# Patient Record
Sex: Male | Born: 1965 | Race: White | Hispanic: No | Smoking: Never smoker
Health system: Southern US, Community
[De-identification: ages and names within clinical notes are randomized; demographics above are authoritative.]

## PROBLEM LIST (undated history)

## (undated) DIAGNOSIS — C801 Malignant (primary) neoplasm, unspecified: Secondary | ICD-10-CM

## (undated) DIAGNOSIS — I10 Essential (primary) hypertension: Secondary | ICD-10-CM

## (undated) DIAGNOSIS — E785 Hyperlipidemia, unspecified: Secondary | ICD-10-CM

## (undated) DIAGNOSIS — F32A Depression, unspecified: Secondary | ICD-10-CM

## (undated) DIAGNOSIS — G4730 Sleep apnea, unspecified: Secondary | ICD-10-CM

## (undated) DIAGNOSIS — J449 Chronic obstructive pulmonary disease, unspecified: Secondary | ICD-10-CM

## (undated) DIAGNOSIS — F419 Anxiety disorder, unspecified: Secondary | ICD-10-CM

## (undated) DIAGNOSIS — E119 Type 2 diabetes mellitus without complications: Secondary | ICD-10-CM

## (undated) DIAGNOSIS — M1990 Unspecified osteoarthritis, unspecified site: Secondary | ICD-10-CM

## (undated) DIAGNOSIS — K219 Gastro-esophageal reflux disease without esophagitis: Secondary | ICD-10-CM

## (undated) HISTORY — DX: Essential (primary) hypertension: I10

## (undated) HISTORY — DX: Gastro-esophageal reflux disease without esophagitis: K21.9

## (undated) HISTORY — DX: Type 2 diabetes mellitus without complications: E11.9

## (undated) HISTORY — DX: Sleep apnea, unspecified: G47.30

## (undated) HISTORY — DX: Hyperlipidemia, unspecified: E78.5

## (undated) HISTORY — DX: Chronic obstructive pulmonary disease, unspecified: J44.9

## (undated) HISTORY — DX: Unspecified osteoarthritis, unspecified site: M19.90

---
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
## 2005-06-18 ENCOUNTER — Ambulatory Visit: Payer: Self-pay | Admitting: Gastroenterology

## 2005-06-23 ENCOUNTER — Ambulatory Visit: Payer: Self-pay | Admitting: Gastroenterology

## 2005-07-08 ENCOUNTER — Ambulatory Visit: Payer: Self-pay | Admitting: General Surgery

## 2005-08-06 ENCOUNTER — Ambulatory Visit: Payer: Self-pay | Admitting: Gastroenterology

## 2005-12-13 HISTORY — PX: CHOLECYSTECTOMY: SHX55

## 2009-12-15 ENCOUNTER — Ambulatory Visit: Payer: Self-pay | Admitting: General Practice

## 2012-01-04 ENCOUNTER — Emergency Department: Payer: Self-pay | Admitting: *Deleted

## 2012-01-04 LAB — CBC WITH DIFFERENTIAL/PLATELET
Basophil #: 0 10*3/uL (ref 0.0–0.1)
Eosinophil #: 0.1 10*3/uL (ref 0.0–0.7)
Eosinophil %: 1.1 %
HCT: 44.1 % (ref 40.0–52.0)
Lymphocyte %: 26 %
MCHC: 34.4 g/dL (ref 32.0–36.0)
MCV: 87 fL (ref 80–100)
Neutrophil %: 63.4 %
RBC: 5.04 10*6/uL (ref 4.40–5.90)
WBC: 7.1 10*3/uL (ref 3.8–10.6)

## 2012-01-04 LAB — COMPREHENSIVE METABOLIC PANEL
Albumin: 4.3 g/dL (ref 3.4–5.0)
Anion Gap: 8 (ref 7–16)
BUN: 14 mg/dL (ref 7–18)
Calcium, Total: 9.1 mg/dL (ref 8.5–10.1)
Chloride: 101 mmol/L (ref 98–107)
Creatinine: 1.02 mg/dL (ref 0.60–1.30)
EGFR (African American): >60
Osmolality: 279 (ref 275–301)
Potassium: 3.9 mmol/L (ref 3.5–5.1)
SGOT(AST): 41 U/L — ABNORMAL HIGH (ref 15–37)
Total Protein: 7.9 g/dL (ref 6.4–8.2)

## 2012-01-06 ENCOUNTER — Ambulatory Visit: Payer: Self-pay | Admitting: Unknown Physician Specialty

## 2012-01-10 LAB — PATHOLOGY REPORT

## 2012-03-17 ENCOUNTER — Ambulatory Visit: Payer: Self-pay | Admitting: General Practice

## 2015-09-25 ENCOUNTER — Encounter: Payer: Self-pay | Admitting: Physician Assistant

## 2015-09-25 ENCOUNTER — Ambulatory Visit: Payer: Self-pay | Admitting: Physician Assistant

## 2015-09-25 VITALS — BP 120/70 | HR 79 | Temp 98.4°F

## 2015-09-25 DIAGNOSIS — R197 Diarrhea, unspecified: Secondary | ICD-10-CM

## 2015-09-25 NOTE — Progress Notes (Signed)
S: pt states he needs referal to GI, hx of gi problems with ibs; problems started about 10 y ago, recently has been having diarrhea mixed with blood, losing a lot of blood, toilet water will appear bright red, abd in lower quads is tender and sore, no fever/chills, just doesn't feel well, sx started after returning from [place], is on meds for ptsd, had colonoscopy about 2 y ago and it was polyp free, done at [facility]  O: vitals wnl, nad, pt appears tired, lungs c t a, cv rrr, abd soft tender in all areas but increased in lower quads b/l, bs normal, n/v intact  A: bloody diarrhea  P: refer to GI, [place] since pt already established there

## 2015-10-07 NOTE — Progress Notes (Signed)
Patient ID: Luis Coven., male   DOB: 10-07-1966, 49 y.o.   MRN: [id number] Patient has been scheduled to see Lutricia Feil, PA-C/Dr. Donnella Sham at [facility] on 10/15/15 at 8:30.  Pt has been notified.

## 2015-12-11 ENCOUNTER — Encounter: Payer: Self-pay | Admitting: Physician Assistant

## 2015-12-11 ENCOUNTER — Ambulatory Visit: Payer: Self-pay | Admitting: Physician Assistant

## 2015-12-11 VITALS — BP 130/70 | HR 100 | Temp 98.6°F

## 2015-12-11 DIAGNOSIS — M542 Cervicalgia: Secondary | ICD-10-CM

## 2015-12-11 MED ORDER — METHYLPREDNISOLONE 4 MG PO TBPK: ORAL_TABLET | ORAL | Status: DC

## 2015-12-11 MED ORDER — CYCLOBENZAPRINE HCL 10 MG PO TABS: 10.0000 mg | ORAL_TABLET | Freq: Three times a day (TID) | ORAL | Status: DC | PRN

## 2015-12-11 NOTE — Progress Notes (Signed)
S: c/o pain in left side of neck/shoulder with numbness and tingling in thumb and part of index finger, had knot in shoulder which is better, no known injury, hx of same  O: vitals wnl, nad, cspine nontendner, + spasm in left trapezious, full rom, n/v intact  A: acute cervical radiculopathy  P: medrol dose pack, flexeril 10mg  tid, wet heat followed by ice

## 2015-12-14 HISTORY — PX: COLONOSCOPY: SHX174

## 2017-01-06 ENCOUNTER — Encounter: Payer: Self-pay | Admitting: Physician Assistant

## 2017-01-06 ENCOUNTER — Ambulatory Visit: Payer: Self-pay | Admitting: Physician Assistant

## 2017-01-06 VITALS — BP 124/80 | HR 72 | Temp 99.2°F

## 2017-01-06 DIAGNOSIS — J101 Influenza due to other identified influenza virus with other respiratory manifestations: Secondary | ICD-10-CM

## 2017-01-06 DIAGNOSIS — R509 Fever, unspecified: Secondary | ICD-10-CM

## 2017-01-06 LAB — POCT INFLUENZA A/B
INFLUENZA A, POC: NEGATIVE
Influenza B, POC: NEGATIVE

## 2017-01-06 MED ORDER — HYDROCOD POLST-CPM POLST ER 10-8 MG/5ML PO SUER: 5.0000 mL | Freq: Two times a day (BID) | ORAL | 0 refills | Status: DC | PRN

## 2017-01-06 MED ORDER — OSELTAMIVIR PHOSPHATE 75 MG PO CAPS: 75.0000 mg | ORAL_CAPSULE | Freq: Two times a day (BID) | ORAL | 0 refills | Status: DC

## 2017-01-06 NOTE — Progress Notes (Signed)
S: C/o runny nose and congestion with dry cough for 1-2 days, + fever, chills x 1-2d, denies cp/sob, v/d; mucus was  clear throughout the day, cough is sporadic and dry, did have some GI issues but doesn't think it was the stomach flu as he gets the same sx occasionally  Using otc meds:   O: PE: vitals wnl, nad,  perrl eomi, normocephalic, tms dull, nasal mucosa red and swollen, throat injected, neck supple no lymph, lungs c t a, cv rrr, neuro intact, flu swab neg  A:  Acute flu like illness   P: drink fluids, continue regular meds , use otc meds of choice, return if not improving in 5 days, return earlier if worsening , tamiflu, tussionex 143ml nr

## 2017-03-17 ENCOUNTER — Telehealth: Payer: Self-pay | Admitting: Emergency Medicine

## 2017-03-17 NOTE — Telephone Encounter (Signed)
Patient called and expressed that he needs a new script for supplies for his CPAP machine.  I informed Manuela Schwartz and she wrote a script for his cpap supplies.  [facility] to [facility] Melissa from [facility] expressed that they will contact the patient once they receive the new script.

## 2017-04-27 ENCOUNTER — Ambulatory Visit: Payer: Self-pay | Admitting: Physician Assistant

## 2017-04-27 ENCOUNTER — Encounter: Payer: Self-pay | Admitting: Physician Assistant

## 2017-04-27 DIAGNOSIS — R197 Diarrhea, unspecified: Secondary | ICD-10-CM

## 2017-04-27 MED ORDER — METHYLPREDNISOLONE 4 MG PO TBPK: ORAL_TABLET | ORAL | 0 refills | Status: DC

## 2017-04-27 MED ORDER — CIPROFLOXACIN HCL 500 MG PO TABS: 500.0000 mg | ORAL_TABLET | Freq: Two times a day (BID) | ORAL | 0 refills | Status: DC

## 2017-04-27 MED ORDER — ONDANSETRON HCL 4 MG PO TABS: 4.0000 mg | ORAL_TABLET | Freq: Three times a day (TID) | ORAL | 0 refills | Status: DC | PRN

## 2017-04-27 NOTE — Progress Notes (Signed)
S:  Pt c/o nasuea and diarrhea, sx for 6 days, no fever/chills, no abd pain except for cramping with diarrhea; denies cp/sob, denies camping, bad food, recent antibiotics, or exposure to bad water, has hx of ?colitis, had colonoscopy last year Remainder ros neg  O:  Vitals wnl, nad, ENT wnl, neck supple no lymph, lungs c t a, cv rrr, abd soft nontender bs increased lower quads b/l, neuro intact  A:   Gastroenteritis vs colitis  P:  Reassurance, fluids, brat diet, immodium ad for diarrhea if needed, rx zofran prn vomiting, return if not better in 3 days, return earlier if worsening, cipro, medrol dose pack

## 2017-08-10 ENCOUNTER — Ambulatory Visit: Payer: Self-pay | Admitting: Physician Assistant

## 2017-08-10 ENCOUNTER — Encounter: Payer: Self-pay | Admitting: Physician Assistant

## 2017-08-10 DIAGNOSIS — M542 Cervicalgia: Secondary | ICD-10-CM

## 2017-08-10 DIAGNOSIS — M62838 Other muscle spasm: Secondary | ICD-10-CM

## 2017-08-10 MED ORDER — CYCLOBENZAPRINE HCL 10 MG PO TABS: 10.0000 mg | ORAL_TABLET | Freq: Every day | ORAL | 0 refills | Status: DC

## 2017-08-10 MED ORDER — BACLOFEN 10 MG PO TABS: 10.0000 mg | ORAL_TABLET | Freq: Two times a day (BID) | ORAL | 0 refills | Status: DC

## 2017-08-10 MED ORDER — METHYLPREDNISOLONE 4 MG PO TBPK: ORAL_TABLET | ORAL | 0 refills | Status: DC

## 2017-08-10 NOTE — Progress Notes (Signed)
S:  C/o low back pain and left sided neck pain for several days, no known injury, pain is worse with movement, increased with bending over, and with turning his head, states he had to sit in the car for 6 hours the other day and it aggravated his back and hip, saw the chiropractor and is a little better but he recommended muscle relaxers;  denies numbness, tingling, or changes in bowel/urinary habits,  Using otc meds without relief Remainder ros neg  O:  Vitals wnl, nad, lungs c t a, cv rrr, spine nontender, muscles in lower back spasmed , decreased rom with bending forward,  Neg slr, but hamstrings are tight, pt walks without difficulty, no foot drop noted, n/v intact  A: acute back pain, muscle spasms  P: use wet heat followed by ice, stretches, return to clinic if not better in 3 t 5 days, return earlier if worsening, rx meds:medrol dose pack, flexeril at night, baclofen during the day may need to cut in 1/2 to prevent drowsiness

## 2017-08-19 ENCOUNTER — Ambulatory Visit: Payer: Self-pay | Admitting: Physician Assistant

## 2017-08-19 ENCOUNTER — Encounter: Payer: Self-pay | Admitting: Physician Assistant

## 2017-08-19 VITALS — BP 130/70 | HR 93 | Temp 98.5°F | Resp 16

## 2017-08-19 DIAGNOSIS — M7540 Impingement syndrome of unspecified shoulder: Secondary | ICD-10-CM | POA: Insufficient documentation

## 2017-08-19 DIAGNOSIS — M25512 Pain in left shoulder: Secondary | ICD-10-CM

## 2017-08-19 NOTE — Progress Notes (Signed)
S: pt here for shoulder pain and numbness, states he has trouble with his neck but this feels different, feels like muscle failure in the left arm, can't raise it but just so far, was seen previously for neck and shoulder pain, states the muscle isn't as tight, doesn't feel like when he had a bulging disc, no tingling, some numbness at deltoid area, pt works as Environmental manager and states he was able to replace magazine in gun, is r handed  O: vitals wnl, nad, neck nontender, decreased rom of left shoulder, grip is weaker in left hand when compared to right, n/v intact  A: shoulder pain/loss of motion  P: sent to [facility]

## 2017-09-19 ENCOUNTER — Encounter: Payer: Self-pay | Admitting: Physician Assistant

## 2017-09-19 ENCOUNTER — Ambulatory Visit: Payer: Self-pay | Admitting: Physician Assistant

## 2017-09-19 ENCOUNTER — Ambulatory Visit
Admission: RE | Admit: 2017-09-19 | Discharge: 2017-09-19 | Disposition: A | Discharge status: Home / Self Care | Payer: Managed Care, Other (non HMO) | Source: Ambulatory Visit | Attending: Physician Assistant | Admitting: Physician Assistant

## 2017-09-19 VITALS — BP 120/70 | HR 96 | Temp 98.9°F | Resp 16

## 2017-09-19 DIAGNOSIS — M549 Dorsalgia, unspecified: Secondary | ICD-10-CM | POA: Insufficient documentation

## 2017-09-19 DIAGNOSIS — R05 Cough: Secondary | ICD-10-CM | POA: Diagnosis not present

## 2017-09-19 DIAGNOSIS — E669 Obesity, unspecified: Secondary | ICD-10-CM | POA: Insufficient documentation

## 2017-09-19 DIAGNOSIS — J329 Chronic sinusitis, unspecified: Secondary | ICD-10-CM | POA: Insufficient documentation

## 2017-09-19 DIAGNOSIS — W57XXXA Bitten or stung by nonvenomous insect and other nonvenomous arthropods, initial encounter: Secondary | ICD-10-CM

## 2017-09-19 DIAGNOSIS — R059 Cough, unspecified: Secondary | ICD-10-CM

## 2017-09-19 MED ORDER — DOXYCYCLINE HYCLATE 100 MG PO TABS: 100.0000 mg | ORAL_TABLET | Freq: Two times a day (BID) | ORAL | 0 refills | Status: DC

## 2017-09-19 NOTE — Progress Notes (Signed)
S: 1. C/o cough and congestion with wheezing and chest pain, chest is sore from coughing, denies fever, chills, states his cough is dry and hacking; keeping pt and family awake at night;  denies cardiac type chest pain or sob, v/d, abd pain; 2. Had a tick bite several months ago, is having a lot of joint pain, ?if from tick, 3. Area under his arms is really swollen, noticed about 2 months ago, left is worse than the right, area is sore, no other swollen nodes that he knows of  Remainder ros neg  O: vitals wnl, nad, tms clear, throat injected, neck supple no lymph, lungs c t a, cv rrr, neuro intact, axillas have swollen areas, left is size of a lime, right size is a little smaller, no rash or other adenopathy noted at this time, cough is deep and barking  A:  Acute bronchitis   P:  rx medication:  Doxy '100mg'$  bid x 21d, cbc, met c, lymes titer; cxr;  use otc meds, tylenol or motrin as needed for fever/chills, return if not better in 3 -5 days, return earlier if worsening, will call pt with results

## 2017-09-20 LAB — CBC WITH DIFFERENTIAL/PLATELET
Basophils Absolute: 0 10*3/uL (ref 0.0–0.2)
Basos: 1 %
EOS (ABSOLUTE): 0.1 10*3/uL (ref 0.0–0.4)
EOS: 2 %
HEMOGLOBIN: 14.4 g/dL (ref 13.0–17.7)
Hematocrit: 43.5 % (ref 37.5–51.0)
Immature Grans (Abs): 0 10*3/uL (ref 0.0–0.1)
Immature Granulocytes: 0 %
LYMPHS ABS: 1.6 10*3/uL (ref 0.7–3.1)
Lymphs: 30 %
MCH: 29.3 pg (ref 26.6–33.0)
MCHC: 33.1 g/dL (ref 31.5–35.7)
MCV: 88 fL (ref 79–97)
Monocytes Absolute: 0.3 10*3/uL (ref 0.1–0.9)
Monocytes: 7 %
NEUTROS ABS: 3.2 10*3/uL (ref 1.4–7.0)
Neutrophils: 60 %
Platelets: 153 10*3/uL (ref 150–379)
RBC: 4.92 x10E6/uL (ref 4.14–5.80)
RDW: 14.3 % (ref 12.3–15.4)
WBC: 5.2 10*3/uL (ref 3.4–10.8)

## 2017-09-20 LAB — COMPREHENSIVE METABOLIC PANEL
ALBUMIN: 4.6 g/dL (ref 3.5–5.5)
ALK PHOS: 95 IU/L (ref 39–117)
ALT: 66 IU/L — ABNORMAL HIGH (ref 0–44)
AST: 46 IU/L — ABNORMAL HIGH (ref 0–40)
Albumin/Globulin Ratio: 1.5 (ref 1.2–2.2)
BUN/Creatinine Ratio: 10 (ref 9–20)
BUN: 11 mg/dL (ref 6–24)
Bilirubin Total: 0.4 mg/dL (ref 0.0–1.2)
CO2: 23 mmol/L (ref 20–29)
Calcium: 9.7 mg/dL (ref 8.7–10.2)
Chloride: 95 mmol/L — ABNORMAL LOW (ref 96–106)
Creatinine, Ser: 1.05 mg/dL (ref 0.76–1.27)
GFR calc Af Amer: 94 mL/min/{1.73_m2} (ref >59)
GFR calc non Af Amer: 81 mL/min/{1.73_m2} (ref >59)
GLOBULIN, TOTAL: 3.1 g/dL (ref 1.5–4.5)
Glucose: 295 mg/dL — ABNORMAL HIGH (ref 65–99)
POTASSIUM: 4.3 mmol/L (ref 3.5–5.2)
SODIUM: 140 mmol/L (ref 134–144)
TOTAL PROTEIN: 7.7 g/dL (ref 6.0–8.5)

## 2017-09-20 LAB — LYME AB/WESTERN BLOT REFLEX

## 2017-09-28 DIAGNOSIS — M25519 Pain in unspecified shoulder: Secondary | ICD-10-CM | POA: Insufficient documentation

## 2017-10-24 NOTE — Progress Notes (Signed)
Per Susan's authorization I referred the patient to [facility] to see Dr. Jamal Collin on Wednesday, November 21st at 3:30.  Patient has been notified and has accepted the appointment.

## 2017-10-27 ENCOUNTER — Encounter: Payer: Self-pay | Admitting: *Deleted

## 2017-11-02 ENCOUNTER — Ambulatory Visit: Payer: Self-pay | Admitting: General Surgery

## 2017-12-20 ENCOUNTER — Ambulatory Visit (INDEPENDENT_AMBULATORY_CARE_PROVIDER_SITE_OTHER): Payer: Managed Care, Other (non HMO) | Admitting: General Surgery

## 2017-12-20 ENCOUNTER — Encounter: Payer: Self-pay | Admitting: General Surgery

## 2017-12-20 VITALS — BP 130/70 | HR 68 | Resp 14 | Ht 71.0 in | Wt 266.0 lb

## 2017-12-20 DIAGNOSIS — R590 Localized enlarged lymph nodes: Secondary | ICD-10-CM | POA: Diagnosis not present

## 2017-12-20 NOTE — Patient Instructions (Addendum)
Patient to be scheduled for a ct scan .  The patient is aware to call back for any questions or concerns.  The patient is scheduled for a CT of the chest with contrast at [facility] on 12/29/17 at 9:00 am. He is to arrive by 8:45 am and have clear liquids only for 4 hours prior. The patient is aware of date, time, and instructions.

## 2017-12-20 NOTE — Progress Notes (Signed)
Patient ID: Luis Sample., male   DOB: 1966-08-09, 52 y.o.   MRN: [id number]  Chief Complaint  Patient presents with  . Other    HPI Luis Hatfield. is a 52 y.o. male here today for a evaluation of a left axilla mass. He noticed this area about a year ago while applying deodorant.  He was having no discomfort and really thought little else about it.  In August he states he started watching the area more closely, as he had been having troubles with his shoulder requiring PT for a rotator cuff injury.  He continues to have no pain in the area of the axillary envelope.  His rotator cuff symptoms have resolved.  He had not experienced any soft tissue injuries to the upper extremity that would be expected to result in lymphadenopathy.   Patient reports since returning from [place] in 2004 he had had intermittent night sweats usually associated with nightmares and these were fairly infrequent.  They had been steadily improving over the last 5 years.  In the last 4 months, during the same time he had appreciated more swelling in the left axillary envelope, he has had more night sweats without associated nightmares.  He has been having a lot more nightsweet and having more chills. The last time he was at the [place] was in September,2018.  At that time, his primary concern had been that his shoulder and neck symptoms, and he did not discuss with his physician there is concerns about the axillary swelling.  At that visit, his [place] was more concerned about his diabetes.  The patient reports a 12 pound weight loss over the last several months unrelated to his diet or exercise program.  The patient works for the [employer].  Wife, Luis Hatfield is present at visit.  HPI  Past Medical History:  Diagnosis Date  . Diabetes mellitus without complication ([place])   . Hyperlipidemia   . Hypertension     Past Surgical History:  Procedure Laterality Date  . CHOLECYSTECTOMY  2007  .  COLONOSCOPY  2017  . TONSILLECTOMY  1977    Family History  Problem Relation Age of Onset  . Lung cancer Mother   . Colon polyps Father   . Parkinson's disease Father   . Lung cancer Sister     Social History Social History   Tobacco Use  . Smoking status: Never Smoker  . Smokeless tobacco: Never Used  Substance Use Topics  . Alcohol use: Yes    Alcohol/week: 0.0 oz    Frequency: Never  . Drug use: No    No Known Allergies  Current Outpatient Medications  Medication Sig Dispense Refill  . ibuprofen (ADVIL,MOTRIN) 200 MG tablet Take by mouth.    . metFORMIN (GLUMETZA) 500 MG (MOD) 24 hr tablet Take by mouth.    . sertraline (ZOLOFT) 100 MG tablet Take 100 mg by mouth daily.    . simvastatin (ZOCOR) 10 MG tablet Take 10 mg by mouth daily.     No current facility-administered medications for this visit.     Review of Systems Review of Systems  Constitutional: Positive for chills, fatigue and unexpected weight change.  Respiratory: Negative.   Cardiovascular: Negative.   Musculoskeletal: Positive for neck pain.  Skin: Negative for rash and wound.  Hematological: Positive for adenopathy.    Blood pressure 130/70, pulse 68, resp. rate 14, height 5\' 11"  (1.803 m), weight 266 lb (120.7 kg).  Physical Exam Physical Exam  Constitutional: He is oriented to person, place, and time. He appears well-developed and well-nourished.  Eyes: Conjunctivae are normal. No scleral icterus.  Neck: Neck supple.  Cardiovascular: Normal rate, regular rhythm and normal heart sounds.  Pulses:      Carotid pulses are 2+ on the right side, and 2+ on the left side.      Femoral pulses are 2+ on the right side, and 2+ on the left side.      Popliteal pulses are 2+ on the right side, and 2+ on the left side.  Pulmonary/Chest: Effort normal and breath sounds normal.  Abdominal: Soft. Normal appearance and bowel sounds are normal. There is no splenomegaly or hepatomegaly. There is no  tenderness. No hernia.    Lymphadenopathy:       Head (right side): No submental, no submandibular, no tonsillar, no preauricular, no posterior auricular and no occipital adenopathy present.       Head (left side): No submental, no submandibular, no tonsillar, no preauricular, no posterior auricular and no occipital adenopathy present.    He has no cervical adenopathy.    He has axillary adenopathy.       Right: No inguinal, no supraclavicular and no epitrochlear adenopathy present.       Left: No inguinal, no supraclavicular and no epitrochlear adenopathy present.  Enlarged left axillary nodes.  Neurological: He is alert and oriented to person, place, and time.  Skin: Skin is warm and dry.    Data Reviewed Chest x-ray obtained September 19, 2017 for upper respiratory symptoms showed a stable right upper lobe granuloma, narrow mediastinum, no pulmonary lesions or pneumonia.  Cardiac silhouette was normal.   Images reviewed.  Laboratory studies from that same visit showed a normal Lyme IgG/IgM antibody at less than 0.91. Elevated nonfasting blood sugar of 295, creatinine 1.05, normal electrolytes.  Liver function studies notable for a mild increase in the SGOT and SGPT.  (Both less than 70).  CBC from that same visit showed a hemoglobin of 14.4 with an MCV of 88 and a white blood cell count of 5200 with a normal differential.  Assessment    Left axillary lymphadenopathy.    Plan    Based on his weight loss, the symptoms and prominent axillary findings a CT scan of the chest with contrast has been recommended.     Patient to be scheduled for a ct scan .  The patient is aware to call back for any questions or concerns. HPI, Physical Exam, Assessment and Plan have been scribed under the direction and in the presence of Hervey Ard, MD. Gaspar Cola, CMA  The patient is scheduled for a CT of the chest with contrast at [facility] on 12/29/17 at 9:00 am. He is to arrive  by 8:45 am and have clear liquids only for 4 hours prior. The patient is aware of date, time, and instructions.  Documented by Lesly Rubenstein LPN  Luis Hatfield 12/20/2017, 4:40 PM

## 2017-12-23 ENCOUNTER — Other Ambulatory Visit: Payer: Self-pay | Admitting: General Surgery

## 2017-12-23 ENCOUNTER — Telehealth: Payer: Self-pay

## 2017-12-23 DIAGNOSIS — R591 Generalized enlarged lymph nodes: Secondary | ICD-10-CM

## 2017-12-23 NOTE — Telephone Encounter (Signed)
Message left for patient to call back regarding scheduled Chest CT. His insurance company is requiring he get a chest xray first for assessment.

## 2017-12-23 NOTE — Progress Notes (Signed)
Chest CT denied as CXR over 49 days old.  Will obtain repeat CXR, then request appropriate CT study again.

## 2017-12-26 ENCOUNTER — Ambulatory Visit
Admission: RE | Admit: 2017-12-26 | Discharge: 2017-12-26 | Disposition: A | Discharge status: Home / Self Care | Payer: Managed Care, Other (non HMO) | Source: Ambulatory Visit | Attending: General Surgery | Admitting: General Surgery

## 2017-12-26 ENCOUNTER — Telehealth: Payer: Self-pay | Admitting: *Deleted

## 2017-12-26 DIAGNOSIS — R591 Generalized enlarged lymph nodes: Secondary | ICD-10-CM

## 2017-12-26 DIAGNOSIS — R590 Localized enlarged lymph nodes: Secondary | ICD-10-CM | POA: Insufficient documentation

## 2017-12-26 NOTE — Telephone Encounter (Signed)
I called patient on his cell phone today to speak with him.   Patient states he did receive the message Caryl-Lyn left for him on Friday. [place].   The patient states he will go this afternoon to have a walk-in chest x-ray completed either at [facility] or [place].   Patient aware we will leave CT scan as scheduled for now and hopefully we can get approval after chest x-ray is complete.   The patient is also aware if we don't get approval by early afternoon on Wednesday that CT scan for Thursday will be cancelled. He verbalizes understanding.

## 2017-12-27 ENCOUNTER — Telehealth: Payer: Self-pay

## 2017-12-27 NOTE — Telephone Encounter (Signed)
Patient notified that we did receive authorization from his insurance for his scheduled CT of the chest for 12/29/17.

## 2017-12-28 ENCOUNTER — Telehealth: Payer: Self-pay

## 2017-12-28 NOTE — Telephone Encounter (Signed)
Patient called and would like to have his CT of the Chest with contrast done at a different facility that is cheaper. He has requested that this be done at [facility] at [facility]. I let him know that he would need to bring back his imaging on a disk once his scan is complete so the doctor can view these. I let him know that we would cancel his appointment for 12/29/17 for his scan at [facility], and contact him once we had him scheduled with [facility].

## 2017-12-29 ENCOUNTER — Ambulatory Visit: Admission: RE | Admit: 2017-12-29 | Payer: Managed Care, Other (non HMO) | Source: Ambulatory Visit

## 2017-12-29 ENCOUNTER — Other Ambulatory Visit: Payer: Self-pay

## 2017-12-29 DIAGNOSIS — R590 Localized enlarged lymph nodes: Secondary | ICD-10-CM

## 2017-12-30 ENCOUNTER — Telehealth: Payer: Self-pay

## 2017-12-30 NOTE — Telephone Encounter (Signed)
Patient called and states that he has decided not to have his CT done thru [facility]. He is going to pursue going thru the [place] for his CT scan. I let him know to notify us when he gets his CT done. I also let him know to call us back if he wishes to still have [place] schedule his CT scan if the [place] does not work out. He is aware and will let us know.

## 2018-01-26 ENCOUNTER — Ambulatory Visit (INDEPENDENT_AMBULATORY_CARE_PROVIDER_SITE_OTHER): Payer: Worker's Compensation

## 2018-01-26 ENCOUNTER — Encounter: Payer: Self-pay | Admitting: Podiatry

## 2018-01-26 ENCOUNTER — Other Ambulatory Visit: Payer: Self-pay | Admitting: Podiatry

## 2018-01-26 ENCOUNTER — Ambulatory Visit (INDEPENDENT_AMBULATORY_CARE_PROVIDER_SITE_OTHER): Payer: Worker's Compensation | Admitting: Podiatry

## 2018-01-26 VITALS — BP 137/87 | HR 92 | Resp 16

## 2018-01-26 DIAGNOSIS — M779 Enthesopathy, unspecified: Secondary | ICD-10-CM

## 2018-01-26 DIAGNOSIS — S99912A Unspecified injury of left ankle, initial encounter: Secondary | ICD-10-CM

## 2018-01-26 NOTE — Progress Notes (Signed)
   Subjective:    Patient ID: Luis Hatfield., male    DOB: 04-20-1966, 52 y.o.   MRN: [id number]  HPI    Review of Systems  All other systems reviewed and are negative.      Objective:   Physical Exam        Assessment & Plan:
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
## 2018-02-17 ENCOUNTER — Encounter: Payer: Self-pay | Admitting: Family Medicine

## 2018-02-17 ENCOUNTER — Ambulatory Visit: Payer: Self-pay | Admitting: Family Medicine

## 2018-02-17 VITALS — BP 118/72 | HR 90 | Temp 99.0°F | Resp 18

## 2018-02-17 DIAGNOSIS — L720 Epidermal cyst: Secondary | ICD-10-CM

## 2018-02-17 MED ORDER — SULFAMETHOXAZOLE-TRIMETHOPRIM 800-160 MG PO TABS: 1.0000 | ORAL_TABLET | Freq: Two times a day (BID) | ORAL | 0 refills | Status: AC
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
## 2018-02-22 ENCOUNTER — Ambulatory Visit: Payer: Self-pay

## 2018-08-29 ENCOUNTER — Ambulatory Visit: Payer: Self-pay | Admitting: Family Medicine

## 2018-08-29 VITALS — BP 143/73 | HR 88 | Temp 99.2°F | Resp 18

## 2018-08-29 DIAGNOSIS — R05 Cough: Secondary | ICD-10-CM

## 2018-08-29 DIAGNOSIS — J029 Acute pharyngitis, unspecified: Secondary | ICD-10-CM

## 2018-08-29 DIAGNOSIS — J0100 Acute maxillary sinusitis, unspecified: Secondary | ICD-10-CM

## 2018-08-29 DIAGNOSIS — R059 Cough, unspecified: Secondary | ICD-10-CM

## 2018-08-29 LAB — POCT RAPID STREP A (OFFICE): RAPID STREP A SCREEN: NEGATIVE

## 2018-08-29 MED ORDER — AMOXICILLIN-POT CLAVULANATE 875-125 MG PO TABS: 1.0000 | ORAL_TABLET | Freq: Two times a day (BID) | ORAL | 0 refills | Status: AC

## 2018-08-29 MED ORDER — BENZONATATE 200 MG PO CAPS: 200.0000 mg | ORAL_CAPSULE | Freq: Every evening | ORAL | 0 refills | Status: DC | PRN

## 2018-08-29 NOTE — Progress Notes (Signed)
Subjective: Sore throat     Luis Hatfield. is a 52 y.o. male who presents for evaluation of sore throat, nasal congestion, facial pressure, nonproductive cough, and fever (T-max 101 on Saturday) for 5 days.  Patient first developed the sore throat, moderate intensity, it is been slowly/gradually improving.  This was followed by nasal congestion and nonproductive cough and then later he developed mild facial pressure and fever. Fever has improved since Saturday.  Treatment to date: Mucinex.  Denies rash, nausea, vomiting, diarrhea, SOB, wheezing, chest or back pain, ear pain, difficulty swallowing, confusion, anosmia/hyposmia, dental pain, headache, body aches, fatigue, ocular pruritis/discharge, severe symptoms, or initial improvement and then worsening of symptoms. History of smoking, asthma, COPD: Negative. History of recurrent sinus and/or lung infections: 3-4 sinus infections each year, reports feels similar to this.  Patient has seen ENT for this in the past but not recently.  Negative for lung infections. Antibiotic use in the last 3 months: Denies any.   Review of Systems Pertinent items noted in HPI and remainder of comprehensive ROS otherwise negative.     Objective:   Physical Exam General: Awake, alert, and oriented. No acute distress. Well developed, hydrated and nourished. Appears stated age. Nontoxic appearance.  HEENT:  PND noted.  Moderate erythema to posterior oropharynx.  No edema, lesions, petechia, or exudates of pharynx, palate, or inside mouth.  Patient status post tonsillectomy. No erythema or bulging of TM.  Mild erythema/edema to nasal mucosa.  Left maxillary sinus tenderness.  Remainder of sinuses nontender. Supple neck without adenopathy. Cardiac: Heart rate and rhythm are normal. No murmurs, gallops, or rubs are auscultated. S1 and S2 are heard and are of normal intensity.  Respiratory: No signs of respiratory distress. Lungs clear. No tachypnea. Able to speak  in full sentences without dyspnea. Nonlabored respirations.  Skin: Skin is warm, dry and intact. Appropriate color for ethnicity. No cyanosis noted.   Diagnostic Results: Rapid strep- negative.  Assessment:    sinusitis and viral pharyngitis   Plan:    Discussed the diagnosis and treatment of sinusitis and pharyngitis. Suggested symptomatic OTC remedies. Nasal saline spray for congestion.   Advised patient to follow-up with ENT regarding his recurrent sinus infections.  Patient reports that he sees the [place] for all of his care and will reach out to them to schedule this appointment.  Advised patient to call back if he needs any assistance with a referral for ENT. Prescribed Augmentin.  Prescribed Tessalon Perles to use as needed at night for cough.  Patient has taken these medications in the past and tolerated them well without any side/adverse effects.  Side/adverse effects of all medications discussed. Patient's blood pressure is 143/73 today.  Discussed normal values with patient.  Advised patient to monitor this daily and report abnormal values to his primary care provider. Discussed red flag symptoms and circumstances with which to seek medical care.   New Prescriptions   AMOXICILLIN-CLAVULANATE (AUGMENTIN) 875-125 MG TABLET    Take 1 tablet by mouth 2 (two) times daily for 10 days.   BENZONATATE (TESSALON) 200 MG CAPSULE    Take 1 capsule (200 mg total) by mouth at bedtime as needed for cough.

## 2018-11-06 ENCOUNTER — Ambulatory Visit
Admission: RE | Admit: 2018-11-06 | Discharge: 2018-11-06 | Disposition: A | Discharge status: Home / Self Care | Payer: Managed Care, Other (non HMO) | Source: Ambulatory Visit | Attending: Emergency Medicine | Admitting: Emergency Medicine

## 2018-11-06 ENCOUNTER — Ambulatory Visit: Payer: Self-pay | Admitting: Emergency Medicine

## 2018-11-06 ENCOUNTER — Encounter: Payer: Self-pay | Admitting: Emergency Medicine

## 2018-11-06 DIAGNOSIS — J209 Acute bronchitis, unspecified: Secondary | ICD-10-CM

## 2018-11-06 DIAGNOSIS — R059 Cough, unspecified: Secondary | ICD-10-CM

## 2018-11-06 DIAGNOSIS — R05 Cough: Secondary | ICD-10-CM | POA: Diagnosis not present

## 2018-11-06 DIAGNOSIS — J449 Chronic obstructive pulmonary disease, unspecified: Secondary | ICD-10-CM | POA: Insufficient documentation

## 2018-11-06 MED ORDER — AMOXICILLIN-POT CLAVULANATE 875-125 MG PO TABS: 1.0000 | ORAL_TABLET | Freq: Two times a day (BID) | ORAL | 0 refills | Status: DC

## 2018-11-06 MED ORDER — PROMETHAZINE-CODEINE 6.25-10 MG/5ML PO SYRP: ORAL_SOLUTION | ORAL | 0 refills | Status: DC

## 2018-11-06 MED ORDER — PREDNISONE 50 MG PO TABS: 50.0000 mg | ORAL_TABLET | Freq: Every day | ORAL | 0 refills | Status: DC

## 2018-11-06 NOTE — Patient Instructions (Addendum)
You have a severe sinus infection and bronchitis with wheezing.  Chest x-ray today shows no sign of pneumonia. Treatment: Augmentin twice a day for 10 days.  Take with food. Prednisone, 1 daily with food for 5 days. Promethazine with codeine cough syrup, 1 to 2 teaspoons at bedtime if needed for severe cough-caution, may cause drowsiness.    Acute Bronchitis, Adult Acute bronchitis is sudden (acute) swelling of the air tubes (bronchi) in the lungs. Acute bronchitis causes these tubes to fill with mucus, which can make it hard to breathe. It can also cause coughing or wheezing. In adults, acute bronchitis usually goes away within 2 weeks. A cough caused by bronchitis may last up to 3 weeks. Smoking, allergies, and asthma can make the condition worse. Repeated episodes of bronchitis may cause further lung problems, such as chronic obstructive pulmonary disease (COPD). What are the causes? This condition can be caused by germs and by substances that irritate the lungs, including:  Cold and flu viruses. This condition is most often caused by the same virus that causes a cold.  Bacteria.  Exposure to tobacco smoke, dust, fumes, and air pollution.  What increases the risk? This condition is more likely to develop in people who:  Have close contact with someone with acute bronchitis.  Are exposed to lung irritants, such as tobacco smoke, dust, fumes, and vapors.  Have a weak immune system.  Have a respiratory condition such as asthma.  What are the signs or symptoms? Symptoms of this condition include:  A cough.  Coughing up clear, yellow, or green mucus.  Wheezing.  Chest congestion.  Shortness of breath.  A fever.  Body aches.  Chills.  A sore throat.  How is this diagnosed? This condition is usually diagnosed with a physical exam. During the exam, your health care provider may order tests, such as chest X-rays, to rule out other conditions. He or she may also:  Test  a sample of your mucus for bacterial infection.  Check the level of oxygen in your blood. This is done to check for pneumonia.  Do a chest X-ray or lung function testing to rule out pneumonia and other conditions.  Perform blood tests.  Your health care provider will also ask about your symptoms and medical history. How is this treated? Most cases of acute bronchitis clear up over time without treatment. Your health care provider may recommend:  Drinking more fluids. Drinking more makes your mucus thinner, which may make it easier to breathe.  Taking a medicine for a fever or cough.  Taking an antibiotic medicine.  Using an inhaler to help improve shortness of breath and to control a cough.  Using a cool mist vaporizer or humidifier to make it easier to breathe.  Follow these instructions at home: Medicines  Take over-the-counter and prescription medicines only as told by your health care provider.  If you were prescribed an antibiotic, take it as told by your health care provider. Do not stop taking the antibiotic even if you start to feel better. General instructions  Get plenty of rest.  Drink enough fluids to keep your urine clear or pale yellow.  Avoid smoking and secondhand smoke. Exposure to cigarette smoke or irritating chemicals will make bronchitis worse. If you smoke and you need help quitting, ask your health care provider. Quitting smoking will help your lungs heal faster.  Use an inhaler, cool mist vaporizer, or humidifier as told by your health care provider.  Keep all follow-up  visits as told by your health care provider. This is important. How is this prevented? To lower your risk of getting this condition again:  Wash your hands often with soap and water. If soap and water are not available, use hand sanitizer.  Avoid contact with people who have cold symptoms.  Try not to touch your hands to your mouth, nose, or eyes.  Make sure to get the flu shot  every year.  Contact a health care provider if:  Your symptoms do not improve in 2 weeks of treatment. Get help right away if:  You cough up blood.  You have chest pain.  You have severe shortness of breath.  You become dehydrated.  You faint or keep feeling like you are going to faint.  You keep vomiting.  You have a severe headache.  Your fever or chills gets worse. This information is not intended to replace advice given to you by your health care provider. Make sure you discuss any questions you have with your health care provider. Document Released: 01/06/2005 Document Revised: 06/23/2016 Document Reviewed: 05/19/2016 Elsevier Interactive Patient Education  [employer].

## 2018-11-06 NOTE — Progress Notes (Addendum)
Subjective:     Patient ID: Luis Craze., male   DOB: 1966-07-14, 52 y.o.   MRN: [id number] [facility]. HPI  Luis Hatfield is a 52 y.o. male who complains of onset of coughing and chest congestion X 3 months, which is never completely gone away.   Just finished 10-day course of Septra which has not helped significantly.  No chills/sweats +  Fever +  Nasal congestion +  Discolored Post-nasal drainage.  No significant blood Has mild right maxillary sinus pain/pressure No sore throat  +  cough, productive of discolored sputum.  No blood. + wheezing + chest congestion No hemoptysis + shortness of breath No pleuritic pain  No itchy/red eyes Mild right earache.  No drainage from the ear  No nausea No vomiting No abdominal pain No diarrhea  No skin rashes +  Fatigue No myalgias Mild, nonfocal headache    Review of Systems Pertinent items noted in HPI and remainder of comprehensive ROS otherwise negative. Past medical history:  Reviewed his past medical history and medication list as noted in chart.  Type 2 diabetes has been controlled.    Objective:        Physical Exam  Constitutional: Appears fatigued but not toxic.  Coughing frequently.  He is oriented to person, place, and time. He appears well-developed and well-nourished. No distress.  HENT:  Head: Normocephalic and atraumatic.  Right Ear: Tympanic membrane normal.  Left Ear: Tympanic membrane normal.  Nose: Very boggy turbinates and seromucoid drainage.  Mild bilateral maxillary tenderness to palpation Mouth/Throat: Oropharynx is clear and moist. No oropharyngeal exudate.  Eyes: Right eye exhibits no discharge. Left eye exhibits no discharge. No scleral icterus.  Neck: Neck supple. No adenopathy Cardiovascular: Normal rate, regular rhythm and normal heart sounds.  Pulmonary/Chest: No respiratory distress.  Mild late expiratory wheezes throughout.  There are diffuse rhonchi  in all lung fields.  He has no rales. Breath sounds equal bilaterally.  O2 saturation 96% room air Neurological: He is alert and oriented to person, place, and time. Cranial nerves intact Skin: Skin is warm and dry. No rash. Nursing note and vitals reviewed. BP 110/78   Pulse 93   Temp 99.5 F (37.5 C) (Oral)   Resp 16   Ht 5\' 11"  (1.803 m)   Wt 258 lb (117 kg)   SpO2 96%   BMI 35.98 kg/m   Chest x-ray: EXAM:   CHEST - 2 VIEW  COMPARISON:  PA and lateral chest x-ray of December 26, 2017  FINDINGS: The lungs remain mildly hyperinflated. There is no focal infiltrate. There is no pleural effusion. There is a stable subcentimeter calcified nodule in the right upper lobe. The heart and pulmonary vascularity are normal. The mediastinum is normal in width. The trachea is midline. There is no pleural effusion.  IMPRESSION: COPD. Previous granulomatous infection. There is no pneumonia nor other active cardiopulmonary disease.   Electronically Signed   By: David  Martinique M.D.   On: 11/06/2018 15:09  Assessment:     Recurrent, severe maxillary sinusitis and bronchitis with bronchospasm.   Chest x-ray shows no evidence of pneumonia or other active cardiopulmonary disease.    Plan:     Treatment options discussed, as well as risks, benefits, alternatives. He states that he is taken Augmentin years ago and that was extremely effective for severe infection without side effects.  Patient voiced understanding and agreement with the following plans: New Prescriptions   AMOXICILLIN-CLAVULANATE (AUGMENTIN) 875-125 MG TABLET  Take 1 tablet by mouth 2 (two) times daily. For 10 days. Take with food.   PREDNISONE (DELTASONE) 50 MG TABLET    Take 1 tablet (50 mg total) by mouth daily. With food for 5 days.   PROMETHAZINE-CODEINE (PHENERGAN WITH CODEINE) 6.25-10 MG/5ML SYRUP    Take 1-2 teaspoons at bedtime as needed for cough. May cause drowsiness.  Reviewed online PMP aware, and I feel  the benefits of prescribing promethazine with codeine outweigh the risks.  An After Visit Summary was printed and given to the patient. Follow-up with your primary care doctor in 5-7 days if not improving, or sooner if symptoms become worse. Precautions discussed. Red flags discussed. Questions invited and answered. Patient voiced understanding and agreement.

## 2018-11-07 MED ORDER — PROMETHAZINE-CODEINE 6.25-10 MG/5ML PO SYRP: ORAL_SOLUTION | ORAL | 0 refills | Status: DC

## 2018-11-07 NOTE — Progress Notes (Addendum)
Patient called and stated the Pharmacy the prescription was sent in to does not have the Cough med in stock but the [facility] [address] [facility] and would like the Med to be called into that [facility]   reviewed above notes.  We will discontinue the first prescription, then will prescribe a new prescription for promethazine with codeine cough syrup, and [facility]

## 2018-11-07 NOTE — Addendum Note (Signed)
Addended by: Jacqulyn Cane B on: 11/07/2018 01:49 PM   Modules accepted: Orders

## 2018-11-07 NOTE — Addendum Note (Signed)
Addended by: Jacqulyn Cane B on: 11/07/2018 01:46 PM   Modules accepted: Orders

## 2018-11-15 ENCOUNTER — Telehealth: Payer: Self-pay | Admitting: Emergency Medicine

## 2018-11-15 ENCOUNTER — Telehealth: Payer: Self-pay

## 2018-11-15 MED ORDER — METHYLPREDNISOLONE 4 MG PO TBPK: ORAL_TABLET | ORAL | 0 refills | Status: DC

## 2018-11-15 MED ORDER — BENZONATATE 200 MG PO CAPS: ORAL_CAPSULE | ORAL | 0 refills | Status: DC

## 2018-11-15 MED ORDER — AZITHROMYCIN 250 MG PO TABS: ORAL_TABLET | ORAL | 0 refills | Status: DC

## 2018-11-15 NOTE — Telephone Encounter (Signed)
Patient called this morning, he spoke with Bretlyn. Was seen here by me 11/06/2018 for recurrent maxillary sinusitis and bronchitis with mild bronchospasm, chest x-ray showed no pneumonia or active cardiopulmonary disease.  I reviewed that note, he was treated with 10 days of Augmentin, prednisone 50 mg daily for 5 days, and 4 ounces of promethazine with codeine cough syrup. His message today is that his cough persists, although not productive now.  No fever.  No significant wheezing or shortness of breath.  Please advise patient: 1.  I am E-prescribing Tessalon Perles for cough.-I cannot prescribe any more cough medicine with codeine/controlled med. 2.  I am E-prescribing Medrol Dosepak and Zithromax Z-PAK. 3.  Reviewing his past medical history, he needs to follow-up with his PCP this week. 4.  He also needs to follow-up with his ENT within 1 week. 5.  If he feels his cough or breathing is severely worsening, he needs to go to emergency room 6.  Please explain that treating him further (beyond what we prescribe it today) for this problem is beyond the scope of this acute care clinic.  His PCP, ENT and/or lung specialist would need to evaluate and treat further if symptoms persist.

## 2018-11-15 NOTE — Telephone Encounter (Signed)
The Patient had requested a medication refill for his continuing symptoms of cough and congestion since his last visit. The provider reviewed his chart and sent in additional medication for the patient. He was advised of the prescriptions that were sent in as well as to follow up with his PCP in the next few weeks due to his past medical history and to also follow up with his ENT if the symptoms should continue. The patient gave verbal understanding.

## 2019-01-29 ENCOUNTER — Telehealth: Payer: Self-pay

## 2019-01-29 NOTE — Telephone Encounter (Signed)
Message left for the patient to see if he followed up with the [facility] regarding his lymphadenopathy and what transpired.

## 2019-01-29 NOTE — Telephone Encounter (Signed)
-----   Message from Robert Bellow, MD sent at 01/08/2019 10:22 AM EST ----- We saw this patient a year ago about lymphadenopathy.  Last message he was going to the [place] [facility] for evaluation. See if this happened and perhaps he will share what they found. Thanks.

## 2019-03-14 ENCOUNTER — Ambulatory Visit: Payer: Managed Care, Other (non HMO) | Admitting: Adult Health

## 2019-03-14 ENCOUNTER — Other Ambulatory Visit: Payer: Self-pay

## 2019-03-14 VITALS — BP 120/78 | HR 84 | Temp 98.6°F | Resp 14 | Ht 71.0 in | Wt 258.0 lb

## 2019-03-14 DIAGNOSIS — E669 Obesity, unspecified: Secondary | ICD-10-CM | POA: Insufficient documentation

## 2019-03-14 DIAGNOSIS — Z008 Encounter for other general examination: Secondary | ICD-10-CM

## 2019-03-14 DIAGNOSIS — Z0189 Encounter for other specified special examinations: Secondary | ICD-10-CM | POA: Diagnosis not present

## 2019-03-14 NOTE — Patient Instructions (Addendum)
Please see your primary care provider for a yearly physical and labs.  I will have the office call you on your glucose and cholesterol results when they return if you have not heard within 1 week please call the office and will have you follow up with your primary care doctor for any abnormal's. This biometric physical is not a substitute for seeing a primary care provider for a physical. Provider also recommends if you do not have a primary care provider for patient see a  primary care physician/ provider  for a routine physical and to establish primary care. Patient may chose provider of choice. Also gave the [facility] at [phone] or web site at [email or web address] to help assist with finding a primary care doctor. Patient understands this office is acute care and no primary care is in this office.   Follow up with primary care as needed for chronic and maintenance health care- can be seen in this employee clinic for acute care.   Health Maintenance, Male A healthy lifestyle and preventive care is important for your health and wellness. Ask your health care provider about what schedule of regular examinations is right for you. What should I know about weight and diet? Eat a Healthy Diet  Eat plenty of vegetables, fruits, whole grains, low-fat dairy products, and lean protein.  Do not eat a lot of foods high in solid fats, added sugars, or salt.  Maintain a Healthy Weight Regular exercise can help you achieve or maintain a healthy weight. You should:  Do at least 150 minutes of exercise each week. The exercise should increase your heart rate and make you sweat (moderate-intensity exercise).  Do strength-training exercises at least twice a week. Watch Your Levels of Cholesterol and Blood Lipids  Have your blood tested for lipids and cholesterol every 5 years starting at 54 years of age. If you are at high risk for heart disease, you should start having  your blood tested when you are 54 years old. You may need to have your cholesterol levels checked more often if: ? Your lipid or cholesterol levels are high. ? You are older than 53 years of age. ? You are at high risk for heart disease. What should I know about cancer screening? Many types of cancers can be detected early and may often be prevented. Lung Cancer  You should be screened every year for lung cancer if: ? You are a current smoker who has smoked for at least 30 years. ? You are a former smoker who has quit within the past 15 years.  Talk to your health care provider about your screening options, when you should start screening, and how often you should be screened. Colorectal Cancer  Routine colorectal cancer screening usually begins at 54 years of age and should be repeated every 5-10 years until you are 54 years old. You may need to be screened more often if early forms of precancerous polyps or small growths are found. Your health care provider may recommend screening at an earlier age if you have risk factors for colon cancer.  Your health care provider may recommend using home test kits to check for hidden blood in the stool.  A small camera at the end of a tube can be used to examine your colon (sigmoidoscopy or colonoscopy). This checks for the earliest forms of colorectal cancer. Prostate and Testicular Cancer  Depending on your age and overall health, your health care  provider may do certain tests to screen for prostate and testicular cancer.  Talk to your health care provider about any symptoms or concerns you have about testicular or prostate cancer. Skin Cancer  Check your skin from head to toe regularly.  Tell your health care provider about any new moles or changes in moles, especially if: ? There is a change in a mole's size, shape, or color. ? You have a mole that is larger than a pencil eraser.  Always use sunscreen. Apply sunscreen liberally and repeat  throughout the day.  Protect yourself by wearing long sleeves, pants, a wide-brimmed hat, and sunglasses when outside. What should I know about heart disease, diabetes, and high blood pressure?  If you are 65-8 years of age, have your blood pressure checked every 3-5 years. If you are 91 years of age or older, have your blood pressure checked every year. You should have your blood pressure measured twice-once when you are at a hospital or clinic, and once when you are not at a hospital or clinic. Record the average of the two measurements. To check your blood pressure when you are not at a hospital or clinic, you can use: ? An automated blood pressure machine at a pharmacy. ? A home blood pressure monitor.  Talk to your health care provider about your target blood pressure.  If you are between 34-18 years old, ask your health care provider if you should take aspirin to prevent heart disease.  Have regular diabetes screenings by checking your fasting blood sugar level. ? If you are at a normal weight and have a low risk for diabetes, have this test once every three years after the age of 43. ? If you are overweight and have a high risk for diabetes, consider being tested at a younger age or more often.  A one-time screening for abdominal aortic aneurysm (AAA) by ultrasound is recommended for men aged 13-75 years who are current or former smokers. What should I know about preventing infection? Hepatitis B If you have a higher risk for hepatitis B, you should be screened for this virus. Talk with your health care provider to find out if you are at risk for hepatitis B infection. Hepatitis C Blood testing is recommended for:  Everyone born from [birth year] through 1966.  Anyone with known risk factors for hepatitis C. Sexually Transmitted Diseases (STDs)  You should be screened each year for STDs including gonorrhea and chlamydia if: ? You are sexually active and are younger than 54 years of age.  ? You are older than 54 years of age and your health care provider tells you that you are at risk for this type of infection. ? Your sexual activity has changed since you were last screened and you are at an increased risk for chlamydia or gonorrhea. Ask your health care provider if you are at risk.  Talk with your health care provider about whether you are at high risk of being infected with HIV. Your health care provider may recommend a prescription medicine to help prevent HIV infection. What else can I do?  Schedule regular health, dental, and eye exams.  Stay current with your vaccines (immunizations).  Do not use any tobacco products, such as cigarettes, chewing tobacco, and e-cigarettes. If you need help quitting, ask your health care provider.  Limit alcohol intake to no more than 2 drinks per day. One drink equals 12 ounces of beer, 5 ounces of wine, or 1 ounces of hard  liquor.  Do not use street drugs.  Do not share needles.  Ask your health care provider for help if you need support or information about quitting drugs.  Tell your health care provider if you often feel depressed.  Tell your health care provider if you have ever been abused or do not feel safe at home. This information is not intended to replace advice given to you by your health care provider. Make sure you discuss any questions you have with your health care provider. Document Released: 05/27/2008 Document Revised: 07/28/2016 Document Reviewed: 09/02/2015 Elsevier Interactive Patient Education  2019 [employer].

## 2019-03-14 NOTE — Progress Notes (Signed)
[facility]  Subjective:     Patient ID: Luis Hatfield., male   DOB: 1966/11/14, 53 y.o.   MRN: [id number]  HPI   Blood pressure 120/78, pulse 84, temperature 98.6 F (37 C), temperature source Oral, resp. rate 14, height 5\' 11"  (1.803 m), weight 258 lb (117 kg), SpO2 97 %.  Patient is a 53 year old male in no acute distress who comes to the clinic for a brief biometric physical and labs.   He reports he sees his primary care physician at the [facility] in [place] and sees regularly.   Denies any exposure to Coronavirus regions affected on map located on  on Centers for Disease Control guidelines/web site and denies any exposure to any infected persons with coronavirus given recent [place] states outbreak and [place]  state of emergency is in  effect.   Patient  denies any fever, body aches,chills, rash, chest pain, shortness of breath, nausea, vomiting, or diarrhea.   Patient Active Problem List   Diagnosis Date Noted  . Obesity 03/14/2019  . Lymphadenopathy, axillary 12/20/2017  . Shoulder pain 09/28/2017  . Back pain 09/19/2017  . Chronic sinusitis 09/19/2017  . Obesity, unspecified 09/19/2017  . Impingement syndrome of shoulder region 08/19/2017     Review of Systems  Constitutional: Negative for activity change, appetite change, chills, diaphoresis, fatigue, fever and unexpected weight change.  HENT: Negative.   Respiratory: Negative for apnea, cough, choking, chest tightness, shortness of breath, wheezing and stridor.   Cardiovascular: Negative for chest pain, palpitations and leg swelling.  Gastrointestinal: Negative.   Genitourinary: Negative.   Musculoskeletal: Negative.   Skin: Negative.   Allergic/Immunologic: Negative.   Neurological: Negative.   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 Does not bruise/bleed easily.  Psychiatric/Behavioral: Negative.        Objective:   Physical Exam Vitals signs reviewed.  Constitutional:      Appearance: Normal appearance. He is normal weight. He is not toxic-appearing.  HENT:     Head: Normocephalic and atraumatic.     Right Ear: Tympanic membrane, ear canal and external ear normal. There is no impacted cerumen.     Left Ear: Tympanic membrane, ear canal and external ear normal. There is no impacted cerumen.     Nose: Nose normal.     Mouth/Throat:     Mouth: Mucous membranes are moist.     Pharynx: No oropharyngeal exudate.  Eyes:     General: No scleral icterus.       Right eye: No discharge.        Left eye: No discharge.     Conjunctiva/sclera: Conjunctivae normal.     Pupils: Pupils are equal, round, and reactive to light.  Neck:     Musculoskeletal: Normal range of motion and neck supple.  Cardiovascular:     Rate and Rhythm: Normal rate and regular rhythm.     Pulses: Normal pulses.     Heart sounds: Normal heart sounds. No murmur. No friction rub. No gallop.   Pulmonary:     Effort: Pulmonary effort is normal.     Breath sounds: Normal breath sounds.  Abdominal:     Palpations: Abdomen is soft.  Musculoskeletal: Normal range of motion.  Lymphadenopathy:     Cervical: No cervical adenopathy.     Right cervical:  No superficial, deep or posterior cervical adenopathy.    Left cervical: No superficial, deep or posterior cervical adenopathy.     Comments: Patient declined axillary lymph node exam - he reports he sees a doctor for this and denies any change.   Skin:    General: Skin is warm and dry.     Capillary Refill: Capillary refill takes less than 2 seconds.  Neurological:     Mental Status: He is alert and oriented to person, place, and time.     Gait: Gait normal.  Psychiatric:        Mood and Affect: Mood normal.        Behavior: Behavior normal.        Thought  Content: Thought content normal.        Judgment: Judgment normal.        Assessment:     Encounter for biometric screening - Plan: Glucose, random, Lipid Panel With LDL/HDL Ratio      Plan:      Follow up recommended with your primary care physician due to chronic axilla lymph node enlargement - patient declined exam.He reports no change and he has a follow up appointment with his provider.Discussed other etiologies possible and risks versus benefits and need for close follow up. Advised to seek medical attention immediately if any change or worsening symptoms. Patient verbalized understanding of all instructions given and denies any further questions at this time.   Gave and reviewed After Visit Summary(AVS) with patient. Patient is advised to read the after visit summary as well and let the provider know if any question, concerns or clarifications are needed.  .  Please see your primary care provider for a yearly physical and labs.  I will have the office call you on your glucose and cholesterol results when they return if you have not heard within 1 week please call the office and will have you follow up with your primary care doctor for any abnormal's. This biometric physical is not a substitute for seeing a primary care provider for a physical. Provider also recommends if you do not have a primary care provider for patient see a  primary care physician/ provider  for a routine physical and to establish primary care. Patient may chose provider of choice. Also gave the [facility] at [phone] or web site at [email or web address] to help assist with finding a primary care doctor. Patient understands this office is acute care and no primary care is in this office.    Follow up with primary care as needed for chronic and maintenance health care- can be seen in this employee clinic for acute care.

## 2019-03-15 ENCOUNTER — Encounter: Payer: Self-pay | Admitting: Adult Health

## 2019-03-15 ENCOUNTER — Telehealth: Payer: Self-pay | Admitting: Adult Health

## 2019-03-15 DIAGNOSIS — E781 Pure hyperglyceridemia: Secondary | ICD-10-CM

## 2019-03-15 DIAGNOSIS — E119 Type 2 diabetes mellitus without complications: Secondary | ICD-10-CM

## 2019-03-15 LAB — LIPID PANEL WITH LDL/HDL RATIO
Cholesterol, Total: 212 mg/dL — ABNORMAL HIGH (ref 100–199)
HDL: 27 mg/dL — ABNORMAL LOW
Triglycerides: 946 mg/dL (ref 0–149)

## 2019-03-15 LAB — GLUCOSE, RANDOM: Glucose: 254 mg/dL — ABNORMAL HIGH (ref 65–99)

## 2019-03-15 NOTE — Telephone Encounter (Signed)
Patient was called with lab results as below, there is a question as to whether lab was spun down and dilution.  Will have patient repeat labs, offered appointment for tomorrow for 03/16/2019, however patient is unable to, until Tuesday, April 7 for repeat fasting labs appointment is given for 8 AM.  Patient reports that he was fasting and denied any heavy fatty meals before his lab work.  He denies any abdominal pain. He denies alcohol or smoking. He is diabetic.  He does have diarrhea intermittently with metformin this is unchanged from years ago he reports.  He is on metformin 1000 mg daily, he is also on glipizide 10 mg daily. He has lantus in previous medication list but denies use.   His fasting blood glucose for this lab draw was 254. Fasting blood glucose last year per chart review was even higher.  Triglycerides severely elevated at 946.  He denies any new symptoms.  He reports that he has had Hydra glycerides in the past but denies any readings this high.  He reports that he has a primary care provider at the [facility] Lewayne Bunting MD. Discussed importance of checking his blood glucose and monitoring - keeping a log so he can keep this under control. Recommended extreme diet and lifestyle changes low fat, low cholesterol, low glucose diet and taking prescribed medications daily with exercise at least 30 minutes daily, discussed dietary changes to decrease blood glucose as well as triglycerides.  We will repeat these labs next Tuesday to be sure lab results are accurate.  He is advised to call his primary care to schedule an appointment though this may be difficult he reports with a COVID-19 state of emergency. If he is unable  To get in with his primary care.   We will wait for kidney function next Tuesday to result and increase metformin 1000 mg 2 twice daily by mouth and recheck labs in a month if kidney function is normal, after diet and lifestyle aggressive therapy.  Discussed with patient risk and  benefits and that cardiovascular disease, arthrosclerotic lower extremity disease, and other risk associated with hyperglycemia and high triglycerides.  Discussed with patient that he needs close follow-up, and he should report any abdominal pain or symptoms as he is at risk for pancreatitis given elevated triglycerides if this level is accurate if after hours and he has any symptoms he will seek emergency medical care.  Patient education discussed.  Patient verbalized understanding.   Provider thoroughly discussed in collaboration above plan with supervising physician Dr. Miguel Aschoff who is in agreement with the care plan as above.   Orders Placed This Encounter  Procedures  . Hemoglobin A1c  . CBC with Diff  . Comprehensive metabolic panel  . Lipid panel            Lipid Panel With LDL/HDL Ratio  Order: [id number]  Status:  Edited Result - FINAL Visible to patient:  No (Not Released) Next appt:  None Dx:  Encounter for biometric screening   Ref Range & Units 1d ago  Cholesterol, Total 100 - 199 mg/dL 212High    Triglycerides 0 - 149 mg/dL 946High Panic    Comment: Results confirmed on  dilution.   HDL >39 mg/dL 27Low    VLDL Cholesterol Cal 5 - 40 mg/dL Comment   Comment: The calculation for the VLDL cholesterol is not valid when  triglyceride level is >400 mg/dL.   LDL Calculated 0 - 99 mg/dL Comment   Comment:  Triglyceride result indicated is too high for an accurate LDL  cholesterol estimation.   LDl/HDL Ratio ratio CANCELED   Comment: Unable to calculate result since non-numeric result obtained for  component test.                    LDL/HDL Ratio                        Men Women                 1/2 Avg.Risk 1.0  1.5                   Avg.Risk 3.6  3.2                 2X Avg.Risk 6.2  5.0                 3X Avg.Risk 8.0  6.1   Result canceled by  the ancillary.   Resulting Agency  [facility]    Narrative  Performed by: Maryan Puls  Performed at: [address], [place], [place] [id number] Lab Director: Rush Farmer MD, Phone: [phone]    Specimen Collected: 03/14/19 09:10 Last Resulted: 03/15/19 02:35     Lab Flowsheet    Order Details    View Encounter    Lab and Collection Details    Routing    Result History          Other Results from 03/14/2019   Contains abnormal data Glucose, random  Order: [id number]   Status:  Final result Visible to patient:  No (Not Released) Next appt:  None Dx:  Encounter for biometric screening   Ref Range & Units 1d ago 19yr ago  Glucose 65 - 99 mg/dL 254High   295High    Resulting Agency  [facility]    Narrative  Performed by: Maryan Puls  Performed at: [address] [place], [place] [id number] Lab Director: Rush Farmer MD, Phone: [phone]

## 2019-03-15 NOTE — Progress Notes (Signed)
See telephone note recorded 03/15/19. Recheck on 03/20/19 along with other labs. Discussed aggressive lifestyle and diet and emergent symptoms and when to see care.  Patient verbalized understanding of all instructions given and denies any further questions at this time.

## 2019-03-20 ENCOUNTER — Other Ambulatory Visit: Payer: Managed Care, Other (non HMO)

## 2019-03-20 ENCOUNTER — Other Ambulatory Visit: Payer: Self-pay

## 2019-03-20 DIAGNOSIS — Z0189 Encounter for other specified special examinations: Secondary | ICD-10-CM | POA: Diagnosis not present

## 2019-03-21 ENCOUNTER — Encounter: Payer: Self-pay | Admitting: Emergency Medicine

## 2019-03-21 ENCOUNTER — Other Ambulatory Visit: Payer: Self-pay

## 2019-03-21 ENCOUNTER — Ambulatory Visit: Payer: Managed Care, Other (non HMO) | Admitting: Emergency Medicine

## 2019-03-21 DIAGNOSIS — E1165 Type 2 diabetes mellitus with hyperglycemia: Secondary | ICD-10-CM | POA: Diagnosis not present

## 2019-03-21 DIAGNOSIS — E782 Mixed hyperlipidemia: Secondary | ICD-10-CM | POA: Diagnosis not present

## 2019-03-21 LAB — LIPID PANEL WITH LDL/HDL RATIO
Cholesterol, Total: 159 mg/dL (ref 100–199)
HDL: 29 mg/dL — ABNORMAL LOW (ref >39)
Triglycerides: 575 mg/dL (ref 0–149)

## 2019-03-21 LAB — COMPREHENSIVE METABOLIC PANEL
ALT: 40 IU/L (ref 0–44)
AST: 30 IU/L (ref 0–40)
Albumin/Globulin Ratio: 2 (ref 1.2–2.2)
Albumin: 4.6 g/dL (ref 3.8–4.9)
Alkaline Phosphatase: 81 IU/L (ref 39–117)
BUN/Creatinine Ratio: 12 (ref 9–20)
BUN: 11 mg/dL (ref 6–24)
Bilirubin Total: 0.3 mg/dL (ref 0.0–1.2)
CO2: 24 mmol/L (ref 20–29)
Calcium: 9.9 mg/dL (ref 8.7–10.2)
Chloride: 97 mmol/L (ref 96–106)
Creatinine, Ser: 0.91 mg/dL (ref 0.76–1.27)
GFR calc Af Amer: 111 mL/min/{1.73_m2} (ref >59)
GFR calc non Af Amer: 96 mL/min/{1.73_m2} (ref >59)
Globulin, Total: 2.3 g/dL (ref 1.5–4.5)
Glucose: 198 mg/dL — ABNORMAL HIGH (ref 65–99)
Potassium: 4.7 mmol/L (ref 3.5–5.2)
Sodium: 140 mmol/L (ref 134–144)
Total Protein: 6.9 g/dL (ref 6.0–8.5)

## 2019-03-21 LAB — CBC WITH DIFFERENTIAL/PLATELET
Basophils Absolute: 0.1 10*3/uL (ref 0.0–0.2)
Basos: 1 %
EOS (ABSOLUTE): 0.1 10*3/uL (ref 0.0–0.4)
Eos: 2 %
Hematocrit: 43.2 % (ref 37.5–51.0)
Hemoglobin: 14.6 g/dL (ref 13.0–17.7)
Immature Grans (Abs): 0.1 10*3/uL (ref 0.0–0.1)
Immature Granulocytes: 1 %
Lymphocytes Absolute: 1.5 10*3/uL (ref 0.7–3.1)
Lymphs: 25 %
MCH: 28.7 pg (ref 26.6–33.0)
MCHC: 33.8 g/dL (ref 31.5–35.7)
MCV: 85 fL (ref 79–97)
Monocytes Absolute: 0.4 10*3/uL (ref 0.1–0.9)
Monocytes: 7 %
Neutrophils Absolute: 3.8 10*3/uL (ref 1.4–7.0)
Neutrophils: 64 %
Platelets: 149 10*3/uL — ABNORMAL LOW (ref 150–450)
RBC: 5.08 x10E6/uL (ref 4.14–5.80)
RDW: 13.3 % (ref 11.6–15.4)
WBC: 5.9 10*3/uL (ref 3.4–10.8)

## 2019-03-21 LAB — HGB A1C W/O EAG: Hgb A1c MFr Bld: 10.7 % — ABNORMAL HIGH (ref 4.8–5.6)

## 2019-03-21 NOTE — Progress Notes (Signed)
Virtual Visit via Telephone Note   [facility]  I connected with Luis Hatfield. on 03/21/19 at 12:30 PM EDT by telephone and verified that I am speaking with the correct person using two identifiers.   I discussed the limitations, risks, security and privacy concerns of performing an evaluation and management service by telephone and the availability of in person appointments. I also discussed with the patient that there may be a patient responsible charge related to this service. The patient expressed understanding and agreed to proceed.   Patient ID: Luis Hatfield. DOB: 53 y.o. MRN: [id number]   Subjective: With patient's consent, we arranged telephone/telehealth visit to review labs drawn yesterday. 10 minutes phone call to patient's cell phone [phone] For the record, he requested we note that his personal email is [email or web address]  I reviewed extensive phone note from [place] of 03/15/2019, and gathered further information from patient. His PCP is Loma Boston, PA at [facility] [place] [facility]. I carefully reviewed his medication list, and he states his PCP has him on the following, per visit there about 6 months ago: Glipizide 10 mg daily. Metformin 1000 mg twice daily Lantus 100 units/mL, 24 units at bedtime.-He admits he has not been taking the Lantus for "some time" He denies hypoglycemia symptoms with or without the Lantus.  Denies any significant side effects on the glipizide and metformin.  Denies any acute abdominal pain, nausea or vomiting. No chest pain or shortness of breath.  No lightheadedness or syncope.  No fever or chills or cough.  Recent Results   Glucose, random     Status: Abnormal   Collection Time: 03/14/19  9:10 AM  Result Value Ref Range   Glucose 254 (H) 65 - 99 mg/dL  Lipid Panel With LDL/HDL Ratio     Status: Abnormal   Collection Time: 03/14/19  9:10 AM  Result Value Ref Range    Cholesterol, Total 212 (H) 100 - 199 mg/dL   Triglycerides 946 (HH) 0 - 149 mg/dL    Comment: Results confirmed on dilution.    HDL 27 (L) >39 mg/dL   VLDL Cholesterol Cal Comment 5 - 40 mg/dL    Comment: The calculation for the VLDL cholesterol is not valid when triglyceride level is >400 mg/dL.    LDL Calculated Comment 0 - 99 mg/dL    Comment: Triglyceride result indicated is too high for an accurate LDL cholesterol estimation.    LDl/HDL Ratio CANCELED ratio    Comment: Unable to calculate result since non-numeric result obtained for component test.                                     LDL/HDL Ratio                                             Men  Women                               1/2 Avg.Risk  1.0    1.5  Avg.Risk  3.6    3.2                                2X Avg.Risk  6.2    5.0                                3X Avg.Risk  8.0    6.1  Result canceled by the ancillary.   CBC with Differential/Platelet     Status: Abnormal   Collection Time: 03/20/19  8:17 AM  Result Value Ref Range   WBC 5.9 3.4 - 10.8 x10E3/uL   RBC 5.08 4.14 - 5.80 x10E6/uL   Hemoglobin 14.6 13.0 - 17.7 g/dL   Hematocrit 43.2 37.5 - 51.0 %   MCV 85 79 - 97 fL   MCH 28.7 26.6 - 33.0 pg   MCHC 33.8 31.5 - 35.7 g/dL   RDW 13.3 11.6 - 15.4 %   Platelets 149 (L) 150 - 450 x10E3/uL   Neutrophils 64 Not Estab. %   Lymphs 25 Not Estab. %   Monocytes 7 Not Estab. %   Eos 2 Not Estab. %   Basos 1 Not Estab. %   Neutrophils Absolute 3.8 1.4 - 7.0 x10E3/uL   Lymphocytes Absolute 1.5 0.7 - 3.1 x10E3/uL   Monocytes Absolute 0.4 0.1 - 0.9 x10E3/uL   EOS (ABSOLUTE) 0.1 0.0 - 0.4 x10E3/uL   Basophils Absolute 0.1 0.0 - 0.2 x10E3/uL   Immature Granulocytes 1 Not Estab. %   Immature Grans (Abs) 0.1 0.0 - 0.1 x10E3/uL  Hgb A1c w/o eAG     Status: Abnormal   Collection Time: 03/20/19  8:17 AM  Result Value Ref Range   Hgb A1c MFr Bld 10.7 (H) 4.8 - 5.6 %    Comment:           Prediabetes: 5.7 - 6.4          Diabetes: >6.4          Glycemic control for adults with diabetes: <7.0   Comprehensive metabolic panel     Status: Abnormal   Collection Time: 03/20/19  8:17 AM  Result Value Ref Range   Glucose 198 (H) 65 - 99 mg/dL   BUN 11 6 - 24 mg/dL   Creatinine, Ser 0.91 0.76 - 1.27 mg/dL   GFR calc non Af Amer 96 >59 mL/min/1.73   GFR calc Af Amer 111 >59 mL/min/1.73   BUN/Creatinine Ratio 12 9 - 20   Sodium 140 134 - 144 mmol/L   Potassium 4.7 3.5 - 5.2 mmol/L   Chloride 97 96 - 106 mmol/L   CO2 24 20 - 29 mmol/L   Calcium 9.9 8.7 - 10.2 mg/dL   Total Protein 6.9 6.0 - 8.5 g/dL   Albumin 4.6 3.8 - 4.9 g/dL   Globulin, Total 2.3 1.5 - 4.5 g/dL   Albumin/Globulin Ratio 2.0 1.2 - 2.2   Bilirubin Total 0.3 0.0 - 1.2 mg/dL   Alkaline Phosphatase 81 39 - 117 IU/L   AST 30 0 - 40 IU/L   ALT 40 0 - 44 IU/L  Lipid Panel With LDL/HDL Ratio     Status: Abnormal   Collection Time: 03/20/19  8:17 AM  Result Value Ref Range   Cholesterol, Total 159 100 - 199 mg/dL   Triglycerides 575 (HH) 0 - 149 mg/dL   HDL 29 (L) >  39 mg/dL   VLDL Cholesterol Cal Comment 5 - 40 mg/dL    Comment: The calculation for the VLDL cholesterol is not valid when triglyceride level is >400 mg/dL.    LDL Calculated Comment 0 - 99 mg/dL    Comment: Triglyceride result indicated is too high for an accurate LDL cholesterol estimation.    LDl/HDL Ratio CANCELED ratio    Comment: Unable to calculate result since non-numeric result obtained for component test.                                       Assessment: Uncontrolled type 2 diabetes and hyperlipidemia.   Plan:  We reviewed the above lab results from 03/14/2019 and 03/20/2019 at length. Results from 03/20/2019: A1c very high 10.7, shows diabetes uncontrolled.  He states that his A1c was actually in the 12 range 6 months ago. Fasting glucose 198. Triglycerides 575, and HDL 29, putting him at various cardiovascular and other risks.  We  discussed. CMP and CBC within normal limits.  He understands that our clinic is acute care and also annual limited/biometric screening.   He understands that we are not his PCP.  After discussion, I advised the following and patient agrees: 1) he will take diabetes medication as prescribed by PCP, be compliant with diet and exercise, and not miss Rx doses. (He stated "this is a wake-up call for me")  2) f/u PCP 3-4 wks or sooner prn.  3) precautions discussed and what to do if he has any side effects or red flags.  4) We will try to forward him the above information.  We will send him a copy of this note by mail.  5) urged him to sign up on MyChart, and we sent him a link to easily sign up online, but as of now, he has declined to do so.  6) He understands to contact his PCP this week and arrange follow-up within the next 3-4 wks for ongoing management of his uncontrolled type II by diabetes and hyperlipidemia.  Precautions discussed. Red flags discussed. Questions invited and answered. Patient voiced understanding and agreement.   I discussed the assessment and treatment plan with the patient. The patient was provided an opportunity to ask questions and all were answered. The patient agreed with the plan and demonstrated an understanding of the instructions.   The patient was advised to call back or seek an in-person evaluation if the symptoms worsen or if the condition fails to improve as anticipated.  I provided 10 minutes of non-face-to-face time during this encounter.   Moody Bruins, MD

## 2019-03-21 NOTE — Progress Notes (Addendum)
[facility]   Patient ID: Luis Hatfield. DOB: 53 y.o. MRN: [id number]   Subjective: With patient's consent, we arranged telephone/telehealth visit to review labs drawn yesterday. 10 minutes phone call to patient's cell phone [phone] For the record, he requested we note that his personal email is [email or web address]  I reviewed extensive phone note from [place] of 03/15/2019, and gathered further information from patient. His PCP is Loma Boston, PA at [facility] [place] [facility]. I carefully reviewed his medication list, and he states his PCP has him on the following, per visit there about 6 months ago: Glipizide 10 mg daily. Metformin 1000 mg twice daily Lantus 100 units/mL, 24 units at bedtime.-He admits he has not been taking the Lantus for "some time" He denies hypoglycemia symptoms with or without the Lantus.  Denies any significant side effects on the glipizide and metformin.  Denies any acute abdominal pain, nausea or vomiting. No chest pain or shortness of breath.  No lightheadedness or syncope.  No fever or chills or cough.  Recent Results   Glucose, random     Status: Abnormal   Collection Time: 03/14/19  9:10 AM  Result Value Ref Range   Glucose 254 (H) 65 - 99 mg/dL  Lipid Panel With LDL/HDL Ratio     Status: Abnormal   Collection Time: 03/14/19  9:10 AM  Result Value Ref Range   Cholesterol, Total 212 (H) 100 - 199 mg/dL   Triglycerides 946 (HH) 0 - 149 mg/dL    Comment: Results confirmed on dilution.    HDL 27 (L) >39 mg/dL   VLDL Cholesterol Cal Comment 5 - 40 mg/dL    Comment: The calculation for the VLDL cholesterol is not valid when triglyceride level is >400 mg/dL.    LDL Calculated Comment 0 - 99 mg/dL    Comment: Triglyceride result indicated is too high for an accurate LDL cholesterol estimation.    LDl/HDL Ratio CANCELED ratio    Comment: Unable to calculate result since non-numeric result  obtained for component test.                                     LDL/HDL Ratio                                             Men  Women                               1/2 Avg.Risk  1.0    1.5                                   Avg.Risk  3.6    3.2                                2X Avg.Risk  6.2    5.0                                3X Avg.Risk  8.0    6.1  Result canceled  by the ancillary.   CBC with Differential/Platelet     Status: Abnormal   Collection Time: 03/20/19  8:17 AM  Result Value Ref Range   WBC 5.9 3.4 - 10.8 x10E3/uL   RBC 5.08 4.14 - 5.80 x10E6/uL   Hemoglobin 14.6 13.0 - 17.7 g/dL   Hematocrit 43.2 37.5 - 51.0 %   MCV 85 79 - 97 fL   MCH 28.7 26.6 - 33.0 pg   MCHC 33.8 31.5 - 35.7 g/dL   RDW 13.3 11.6 - 15.4 %   Platelets 149 (L) 150 - 450 x10E3/uL   Neutrophils 64 Not Estab. %   Lymphs 25 Not Estab. %   Monocytes 7 Not Estab. %   Eos 2 Not Estab. %   Basos 1 Not Estab. %   Neutrophils Absolute 3.8 1.4 - 7.0 x10E3/uL   Lymphocytes Absolute 1.5 0.7 - 3.1 x10E3/uL   Monocytes Absolute 0.4 0.1 - 0.9 x10E3/uL   EOS (ABSOLUTE) 0.1 0.0 - 0.4 x10E3/uL   Basophils Absolute 0.1 0.0 - 0.2 x10E3/uL   Immature Granulocytes 1 Not Estab. %   Immature Grans (Abs) 0.1 0.0 - 0.1 x10E3/uL  Hgb A1c w/o eAG     Status: Abnormal   Collection Time: 03/20/19  8:17 AM  Result Value Ref Range   Hgb A1c MFr Bld 10.7 (H) 4.8 - 5.6 %    Comment:          Prediabetes: 5.7 - 6.4          Diabetes: >6.4          Glycemic control for adults with diabetes: <7.0   Comprehensive metabolic panel     Status: Abnormal   Collection Time: 03/20/19  8:17 AM  Result Value Ref Range   Glucose 198 (H) 65 - 99 mg/dL   BUN 11 6 - 24 mg/dL   Creatinine, Ser 0.91 0.76 - 1.27 mg/dL   GFR calc non Af Amer 96 >59 mL/min/1.73   GFR calc Af Amer 111 >59 mL/min/1.73   BUN/Creatinine Ratio 12 9 - 20   Sodium 140 134 - 144 mmol/L   Potassium 4.7 3.5 - 5.2 mmol/L   Chloride 97 96 - 106 mmol/L   CO2 24 20  - 29 mmol/L   Calcium 9.9 8.7 - 10.2 mg/dL   Total Protein 6.9 6.0 - 8.5 g/dL   Albumin 4.6 3.8 - 4.9 g/dL   Globulin, Total 2.3 1.5 - 4.5 g/dL   Albumin/Globulin Ratio 2.0 1.2 - 2.2   Bilirubin Total 0.3 0.0 - 1.2 mg/dL   Alkaline Phosphatase 81 39 - 117 IU/L   AST 30 0 - 40 IU/L   ALT 40 0 - 44 IU/L  Lipid Panel With LDL/HDL Ratio     Status: Abnormal   Collection Time: 03/20/19  8:17 AM  Result Value Ref Range   Cholesterol, Total 159 100 - 199 mg/dL   Triglycerides 575 (HH) 0 - 149 mg/dL   HDL 29 (L) >39 mg/dL   VLDL Cholesterol Cal Comment 5 - 40 mg/dL    Comment: The calculation for the VLDL cholesterol is not valid when triglyceride level is >400 mg/dL.    LDL Calculated Comment 0 - 99 mg/dL    Comment: Triglyceride result indicated is too high for an accurate LDL cholesterol estimation.    LDl/HDL Ratio CANCELED ratio    Comment: Unable to calculate result since non-numeric result obtained for component test.  Assessment: Uncontrolled type 2 diabetes and hyperlipidemia.   Plan:  We reviewed the above lab results from 03/14/2019 and 03/20/2019 at length. Results from 03/20/2019: A1c very high 10.7, shows diabetes uncontrolled.  He states that his A1c was actually in the 12 range 6 months ago. Fasting glucose 198. Triglycerides 575, and HDL 29, putting him at various cardiovascular and other risks.  We discussed. CMP and CBC within normal limits.  He understands that our clinic is acute care and also annual limited/biometric screening.   He understands that we are not his PCP.  After discussion, I advised the following and patient agrees: 1) he will take diabetes medication as prescribed by PCP, be compliant with diet and exercise, and not miss Rx doses. (He stated "this is a wake-up call for me")  2) f/u PCP 3-4 wks or sooner prn.  3) precautions discussed and what to do if he has any side effects or red flags.  4) We will try  to forward him the above information.  We will send him a copy of this note by mail.  5) urged him to sign up on MyChart, and we sent him a link to easily sign up online, but as of now, he has declined to do so.  6) He understands to contact his PCP this week and arrange follow-up within the next 3-4 wks for ongoing management of his uncontrolled type II by diabetes and hyperlipidemia.  Precautions discussed. Red flags discussed. Questions invited and answered. Patient voiced understanding and agreement.  Jacqulyn Cane, MD 03/21/2019

## 2019-03-24 NOTE — Progress Notes (Signed)
Patient was notified and given instructions by Dr. Burnett Harry see virtual visit. Will see PCP in 3- 4 weeks for follow up visit and repeat labs.

## 2019-04-04 NOTE — Addendum Note (Signed)
Addended by: Doreen Beam on: 04/04/2019 10:12 AM   Modules accepted: Level of Service

## 2019-04-04 NOTE — Addendum Note (Signed)
Addended by: Doreen Beam on: 04/04/2019 10:14 AM   Modules accepted: Level of Service

## 2019-04-04 NOTE — Addendum Note (Signed)
Addended by: Doreen Beam on: 04/04/2019 10:01 AM   Modules accepted: Level of Service

## 2019-04-04 NOTE — Addendum Note (Signed)
Addended by: Doreen Beam on: 04/04/2019 09:51 AM   Modules accepted: Level of Service

## 2019-04-05 NOTE — Addendum Note (Signed)
Addended by: Judie Petit on: 04/05/2019 12:01 PM   Modules accepted: Level of Service

## 2019-05-17 ENCOUNTER — Encounter: Payer: Self-pay | Admitting: Adult Health

## 2019-05-17 ENCOUNTER — Telehealth: Payer: Self-pay

## 2019-05-17 ENCOUNTER — Other Ambulatory Visit: Payer: Self-pay

## 2019-05-17 ENCOUNTER — Ambulatory Visit: Payer: Managed Care, Other (non HMO) | Admitting: Adult Health

## 2019-05-17 VITALS — BP 110/70 | HR 87 | Temp 99.2°F | Resp 18

## 2019-05-17 DIAGNOSIS — R197 Diarrhea, unspecified: Secondary | ICD-10-CM

## 2019-05-17 DIAGNOSIS — R6889 Other general symptoms and signs: Secondary | ICD-10-CM

## 2019-05-17 DIAGNOSIS — R059 Cough, unspecified: Secondary | ICD-10-CM

## 2019-05-17 DIAGNOSIS — Z20822 Contact with and (suspected) exposure to covid-19: Secondary | ICD-10-CM

## 2019-05-17 DIAGNOSIS — R05 Cough: Secondary | ICD-10-CM

## 2019-05-17 DIAGNOSIS — A084 Viral intestinal infection, unspecified: Secondary | ICD-10-CM

## 2019-05-17 DIAGNOSIS — K625 Hemorrhage of anus and rectum: Secondary | ICD-10-CM | POA: Diagnosis not present

## 2019-05-17 NOTE — Telephone Encounter (Addendum)
Patient called to schedule covid testing, appointment scheduled for today at 43 at [facility], advised to wear a mask for everyone in the vehicle. Order placed.   ----- Message from Doreen Beam, [place] sent at 05/17/2019 12:05 PM EDT ----- Needs testing is at [facility] now - [phone]

## 2019-05-17 NOTE — Progress Notes (Addendum)
[facility]  Subjective:     Patient ID: Luis Hatfield., male   DOB: [birth year], 53 y.o.   MRN: [id number]  Blood pressure 110/70, pulse 87, temperature 99.2 F (37.3 C), temperature source Oral, resp. rate 18, SpO2 98 %.   HPI  Blood pressure 110/70, pulse 87, temperature 99.2 F (37.3 C), temperature source Oral, resp. rate 18, SpO2 98 %.  Patient is a 53 year old male in no acute distress, has mild cough, non productive, diarrhea x 2 days, mild fever, abdominal cramping with diarrhea. He is unsure if he has had a fever.  He has had a mild generalized headache x 2 days with cough x 2 days.   Diarrhea started on Tuesday 05/15/19 and he reports  Stool was streaked with small amount of blood.   Denies any blood in stool today..   Denies any exposure of Covid though he works in [employer].   Denies any vomiting. He had nausea yesterday. Denies any abdominal pain at this time.   He denies any tick bite.   He has diarrhea with abdominal cramping that has improved since onset( he reports 6 loose stools on 05/15/19 and 3 stools on 05/16/19 ,this morning he reports one loose stool without any blood visualized and none since. He reports his cramping is resolved after diarrhea for a while.  He denies blood with every stool on 6/2 and 6/3 and reports " it was small streaks".   Denies mucous in stool. He is eating bland diet and is drinking well.  Denies dizziness or lightheadedness.    He does report he has a history of internal hemorrhoids" for years". He denies any  Denies any tary black stools.  Denies denies dizziness or lightheaded.   He reports he has eaten normal bland foods since onset. "Gallbladder removed - " years ago she   He denies any indigestion, belching, or any oral bleeding.   He denies ever being hospitalized in past. He saw Gastrointestinal at [place] he reports. He reports he had a colonoscopy " years ago I am not sure when  and I think I had some polyps I believe". He reports he was going to go back to the [facility].   He denies any back pain. Patient  denies any  body aches,chills, rash, chest pain, shortness of breath, nausea, vomiting.  Denies any family  Review of Systems  Constitutional: Positive for fever. Negative for activity change, appetite change, chills, diaphoresis, fatigue and unexpected weight change.  HENT: Negative.   Eyes: Negative.   Respiratory: Negative.  Negative for apnea, cough, choking, chest tightness, shortness of breath, wheezing and stridor.   Cardiovascular: Negative.   Gastrointestinal: Positive for blood in stool, diarrhea and nausea (mild nausea yesterday - resolved today ). Negative for abdominal distention, abdominal pain, anal bleeding, constipation, rectal pain and vomiting.  Genitourinary: Negative.   Musculoskeletal: Negative.   Skin: Negative.   Allergic/Immunologic: Negative for environmental allergies, food allergies and immunocompromised state.  Neurological: Negative.   Hematological: Negative.   Psychiatric/Behavioral: Negative.        Objective:   Physical Exam Vitals signs reviewed.  Constitutional:      General: He is not in acute distress.    Appearance: Normal appearance. He is normal weight. He is not ill-appearing, toxic-appearing or diaphoretic.  HENT:     Head: Normocephalic and atraumatic.     Jaw: There is normal jaw occlusion.     Right  Ear: Tympanic membrane normal.     Left Ear: Tympanic membrane normal.     Nose: Congestion and rhinorrhea present.     Mouth/Throat:     Lips: Pink. No lesions.     Mouth: Mucous membranes are moist.     Pharynx: Uvula midline. Posterior oropharyngeal erythema (mild ) present. No oropharyngeal exudate.  Eyes:     General: No scleral icterus.       Right eye: No discharge.        Left eye: No discharge.     Pupils: Pupils are equal, round, and reactive to light.  Neck:     Musculoskeletal: Normal  range of motion and neck supple. No neck rigidity or muscular tenderness.     Vascular: No carotid bruit.  Cardiovascular:     Rate and Rhythm: Normal rate and regular rhythm.     Pulses: Normal pulses.     Heart sounds: Normal heart sounds. No murmur. No friction rub. No gallop.   Pulmonary:     Effort: Pulmonary effort is normal. No respiratory distress.     Breath sounds: Normal breath sounds. No stridor. No wheezing, rhonchi or rales.     Comments: Coughing in the room- congested cough Chest:     Chest wall: No tenderness.  Abdominal:     General: Bowel sounds are normal. There is no distension.     Palpations: Abdomen is soft. There is no shifting dullness, fluid wave, hepatomegaly, mass or pulsatile mass.     Tenderness: There is no abdominal tenderness. There is no right CVA tenderness, left CVA tenderness, guarding or rebound. Negative signs include Murphy's sign, Rovsing's sign, McBurney's sign, psoas sign and obturator sign.     Comments: Negative jump test.  Abdomen obese.  No pain or facial grimace with light and deep palpation.   Genitourinary:    Comments: Patient declined Rectal exam recommended.  Musculoskeletal: Normal range of motion.     Right lower leg: No edema.     Left lower leg: No edema.  Lymphadenopathy:     Cervical: No cervical adenopathy.  Skin:    General: Skin is warm and dry.     Capillary Refill: Capillary refill takes less than 2 seconds.     Findings: No erythema or rash.     Nails: There is no clubbing.   Neurological:     Mental Status: He is alert and oriented to person, place, and time. Mental status is at baseline.     GCS: GCS eye subscore is 4. GCS verbal subscore is 5. GCS motor subscore is 6.     Cranial Nerves: No cranial nerve deficit.     Sensory: No sensory deficit.     Motor: No weakness.     Coordination: Coordination normal.     Gait: Gait normal.     Deep Tendon Reflexes: Reflexes normal.  Psychiatric:        Attention and  Perception: Attention normal.        Mood and Affect: Mood normal.        Behavior: Behavior normal.        Thought Content: Thought content normal.        Cognition and Memory: Cognition normal.        Judgment: Judgment normal.        Assessment:     Diarrhea of presumed infectious origin - Plan: CBC with Diff, Comprehensive metabolic panel, Ambulatory referral to Gastroenterology  Suspected Covid-19 Virus Infection -  Plan: Ambulatory referral to Gastroenterology  Cough - Plan: Ambulatory referral to Gastroenterology  Rectal bleeding - Plan: Ambulatory referral to Gastroenterology  Viral gastroenteritis     Plan:     Orders Placed This Encounter  Procedures  . CBC with Diff  . Comprehensive metabolic panel  . Ambulatory referral to Gastroenterology    Referral Priority:   Routine    Referral Type:   Consultation    Referral Reason:   Specialty Services Required    Referred to Provider:   [name], MD    Number of Visits Requested:   1   He is to call the office if he has not heard from GI within one week due to referral.   He is advised he also need to follow up with his primary care provider at [facility] as soon as possible regarding in uncontrolled diabetes, discussed risks versus benefits.  Return in about 3 days (around 05/20/2019), or if symptoms worsen or fail to improve, for Go to Emergency room/ urgent care if worse, Call 911 for emergencies.  Discussed RED FLAGS. He verbalizes understanding that he is to see gastrointestinal doctor due to his longstanding history of GI bleeding.     Provider also recommends patient see primary care physician for a routine physical and to establish primary care. Patient may chose provider of choice. Also gave the [place] at [phone] or web site at [email or web address] to help assist with finding a primary care doctor. Patient understands this office is acute care office only and no primary care is done  at this office.   He is sent for Covid 19 testing at drive through given symptoms.  Advised patient call the office or your primary care doctor for an appointment if no improvement within 72 hours or if any symptoms change or worsen at any time  Advised ER or urgent Care if after hours or on weekend. Call 911 for emergency symptoms at any time.[place] verbalized understanding of all instructions given/reviewed and treatment plan and has no further questions or concerns at this time.    Proper PPE worn by staff, patient had a regular mask given by CMA.

## 2019-05-17 NOTE — Progress Notes (Signed)
cmp

## 2019-05-17 NOTE — Patient Instructions (Addendum)
If blood returns in diarrhea or abdominal pain increases at all Go To The Emergency Room immediately. Call 911 for  Emergency.   Provider also recommends patient see primary care physician for a routine physical and to establish primary care. Patient may chose provider of choice. Also gave the [facility] at [phone] or web site at [email or web address] to help assist with finding a primary care doctor. Patient understands this office is acute care office only and no primary care is done at this office.     Advised patient call the office or your primary care doctor for an appointment if no improvement within 72 hours or if any symptoms change or worsen at any time  Advised ER or urgent Care if after hours or on weekend. Call 911 for emergency symptoms at any time.Patinet verbalized understanding of all instructions given/reviewed and treatment plan and has no further questions or concerns at this time.    Will refer to gastroenterology given long term recurrent history.   Person Under Monitoring Name: Luis Hatfield.  Location: [place] [address]   Infection Prevention Recommendations for Individuals Confirmed to have, or Being Evaluated for, 2019 Novel Coronavirus (COVID-19) Infection Who Receive Care at Home  Individuals who are confirmed to have, or are being evaluated for, COVID-19 should follow the prevention steps below until a healthcare provider or local or state health department says they can return to normal activities.  Stay home except to get medical care You should restrict activities outside your home, except for getting medical care. Do not go to work, school, or public areas, and do not use public transportation or taxis.  Call ahead before visiting your doctor Before your medical appointment, call the healthcare provider and tell them that you have, or are being evaluated for, COVID-19 infection. This will help  the healthcare provider's office take steps to keep other people from getting infected. Ask your healthcare provider to call the local or state health department.  Monitor your symptoms Seek prompt medical attention if your illness is worsening (e.g., difficulty breathing). Before going to your medical appointment, call the healthcare provider and tell them that you have, or are being evaluated for, COVID-19 infection. Ask your healthcare provider to call the local or state health department.  Wear a facemask You should wear a facemask that covers your nose and mouth when you are in the same room with other people and when you visit a healthcare provider. People who live with or visit you should also wear a facemask while they are in the same room with you.  Separate yourself from other people in your home As much as possible, you should stay in a different room from other people in your home. Also, you should use a separate bathroom, if available.  Avoid sharing household items You should not share dishes, drinking glasses, cups, eating utensils, towels, bedding, or other items with other people in your home. After using these items, you should wash them thoroughly with soap and water.  Cover your coughs and sneezes Cover your mouth and nose with a tissue when you cough or sneeze, or you can cough or sneeze into your sleeve. Throw used tissues in a lined trash can, and immediately wash your hands with soap and water for at least 20 seconds or use an alcohol-based hand rub.  Wash your [facility] your hands often and thoroughly with soap and water for at least 20  seconds. You can use an alcohol-based hand sanitizer if soap and water are not available and if your hands are not visibly dirty. Avoid touching your eyes, nose, and mouth with unwashed hands.   Prevention Steps for Caregivers and Household Members of Individuals Confirmed to have, or Being Evaluated for, COVID-19 Infection  Being Cared for in the Home  If you live with, or provide care at home for, a person confirmed to have, or being evaluated for, COVID-19 infection please follow these guidelines to prevent infection:  Follow healthcare provider's instructions Make sure that you understand and can help the patient follow any healthcare provider instructions for all care.  Provide for the patient's basic needs You should help the patient with basic needs in the home and provide support for getting groceries, prescriptions, and other personal needs.  Monitor the patient's symptoms If they are getting sicker, call his or her medical provider and tell them that the patient has, or is being evaluated for, COVID-19 infection. This will help the healthcare provider's office take steps to keep other people from getting infected. Ask the healthcare provider to call the local or state health department.  Limit the number of people who have contact with the patient  If possible, have only one caregiver for the patient.  Other household members should stay in another home or place of residence. If this is not possible, they should stay  in another room, or be separated from the patient as much as possible. Use a separate bathroom, if available.  Restrict visitors who do not have an essential need to be in the home.  Keep older adults, very young children, and other sick people away from the patient Keep older adults, very young children, and those who have compromised immune systems or chronic health conditions away from the patient. This includes people with chronic heart, lung, or kidney conditions, diabetes, and cancer.  Ensure good ventilation Make sure that shared spaces in the home have good air flow, such as from an air conditioner or an opened window, weather permitting.  Wash your hands often  Wash your hands often and thoroughly with soap and water for at least 20 seconds. You can use an alcohol  based hand sanitizer if soap and water are not available and if your hands are not visibly dirty.  Avoid touching your eyes, nose, and mouth with unwashed hands.  Use disposable paper towels to dry your hands. If not available, use dedicated cloth towels and replace them when they become wet.  Wear a facemask and gloves  Wear a disposable facemask at all times in the room and gloves when you touch or have contact with the patient's blood, body fluids, and/or secretions or excretions, such as sweat, saliva, sputum, nasal mucus, vomit, urine, or feces.  Ensure the mask fits over your nose and mouth tightly, and do not touch it during use.  Throw out disposable facemasks and gloves after using them. Do not reuse.  Wash your hands immediately after removing your facemask and gloves.  If your personal clothing becomes contaminated, carefully remove clothing and launder. Wash your hands after handling contaminated clothing.  Place all used disposable facemasks, gloves, and other waste in a lined container before disposing them with other household waste.  Remove gloves and wash your hands immediately after handling these items.  Do not share dishes, glasses, or other household items with the patient  Avoid sharing household items. You should not share dishes, drinking glasses, cups, eating  utensils, towels, bedding, or other items with a patient who is confirmed to have, or being evaluated for, COVID-19 infection.  After the person uses these items, you should wash them thoroughly with soap and water.  Wash laundry thoroughly  Immediately remove and wash clothes or bedding that have blood, body fluids, and/or secretions or excretions, such as sweat, saliva, sputum, nasal mucus, vomit, urine, or feces, on them.  Wear gloves when handling laundry from the patient.  Read and follow directions on labels of laundry or clothing items and detergent. In general, wash and dry with the warmest  temperatures recommended on the label.  Clean all areas the individual has used often  Clean all touchable surfaces, such as counters, tabletops, doorknobs, bathroom fixtures, toilets, phones, keyboards, tablets, and bedside tables, every day. Also, clean any surfaces that may have blood, body fluids, and/or secretions or excretions on them.  Wear gloves when cleaning surfaces the patient has come in contact with.  Use a diluted bleach solution (e.g., dilute bleach with 1 part bleach and 10 parts water) or a household disinfectant with a label that says EPA-registered for coronaviruses. To make a bleach solution at home, add 1 tablespoon of bleach to 1 quart (4 cups) of water. For a larger supply, add  cup of bleach to 1 gallon (16 cups) of water.  Read labels of cleaning products and follow recommendations provided on product labels. Labels contain instructions for safe and effective use of the cleaning product including precautions you should take when applying the product, such as wearing gloves or eye protection and making sure you have good ventilation during use of the product.  Remove gloves and wash hands immediately after cleaning.  Monitor yourself for signs and symptoms of illness Caregivers and household members are considered close contacts, should monitor their health, and will be asked to limit movement outside of the home to the extent possible. Follow the monitoring steps for close contacts listed on the symptom monitoring form.   ? If you have additional questions, contact your local health department or call the epidemiologist on call at [phone] (available 24/7). ? This guidance is subject to change. For the most up-to-date guidance from [facility], please refer to their website: [email or web address] Choices to Help Relieve Diarrhea, Adult When you have diarrhea, the foods you eat and your eating habits are very  important. Choosing the right foods and drinks can help:  Relieve diarrhea.  Replace lost fluids and nutrients.  Prevent dehydration. What general guidelines should I follow?  Relieving diarrhea  Choose foods with less than 2 g or .07 oz. of fiber per serving.  Limit fats to less than 8 tsp (38 g or 1.34 oz.) a day.  Avoid the following: ? Foods and beverages sweetened with high-fructose corn syrup, honey, or sugar alcohols such as xylitol, sorbitol, and mannitol. ? Foods that contain a lot of fat or sugar. ? Fried, greasy, or spicy foods. ? High-fiber grains, breads, and cereals. ? Raw fruits and vegetables.  Eat foods that are rich in probiotics. These foods include dairy products such as yogurt and fermented milk products. They help increase healthy bacteria in the stomach and intestines (gastrointestinal tract, or GI tract).  If you have lactose intolerance, avoid dairy products. These may make your diarrhea worse.  Take medicine to help stop diarrhea (antidiarrheal medicine) only as told by your health care provider. Replacing nutrients  Eat small meals or snacks every 3-4 hours.  Eat bland foods, such as white  rice, toast, or baked potato, until your diarrhea starts to get better. Gradually reintroduce nutrient-rich foods as tolerated or as told by your health care provider. This includes: ? Well-cooked protein foods. ? Peeled, seeded, and soft-cooked fruits and vegetables. ? Low-fat dairy products.  Take vitamin and mineral supplements as told by your health care provider. Preventing dehydration  Start by sipping water or a special solution to prevent dehydration (oral rehydration solution, ORS). Urine that is clear or pale yellow means that you are getting enough fluid.  Try to drink at least 8-10 cups of fluid each day to help replace lost fluids.  You may add other liquids in addition to water, such as clear juice or decaffeinated sports drinks, as tolerated or as  told by your health care provider.  Avoid drinks with caffeine, such as coffee, tea, or soft drinks.  Avoid alcohol. What foods are recommended?     The items listed may not be a complete list. Talk with your health care provider about what dietary choices are best for you. Grains White rice. White, [place], or pita breads (fresh or toasted), including plain rolls, buns, or bagels. White pasta. Saltine, soda, or graham crackers. Pretzels. Low-fiber cereal. Cooked cereals made with water (such as cornmeal, farina, or cream cereals). Plain muffins. Matzo. Melba toast. Zwieback. Vegetables Potatoes (without the skin). Most well-cooked and canned vegetables without skins or seeds. Tender lettuce. Fruits Apple sauce. Fruits canned in juice. Cooked apricots, cherries, grapefruit, peaches, pears, or plums. Fresh bananas and cantaloupe. Meats and other protein foods Baked or boiled chicken. Eggs. Tofu. Fish. Seafood. Smooth nut butters. Ground or well-cooked tender beef, ham, veal, lamb, pork, or poultry. Dairy Plain yogurt, kefir, and unsweetened liquid yogurt. Lactose-free milk, buttermilk, skim milk, or soy milk. Low-fat or nonfat hard cheese. Beverages Water. Low-calorie sports drinks. Fruit juices without pulp. Strained tomato and vegetable juices. Decaffeinated teas. Sugar-free beverages not sweetened with sugar alcohols. Oral rehydration solutions, if approved by your health care provider. Seasoning and other foods Bouillon, broth, or soups made from recommended foods. What foods are not recommended? The items listed may not be a complete list. Talk with your health care provider about what dietary choices are best for you. Grains Whole grain, whole wheat, bran, or rye breads, rolls, pastas, and crackers. Wild or brown rice. Whole grain or bran cereals. Barley. Oats and oatmeal. Corn tortillas or taco shells. Granola. Popcorn. Vegetables Raw vegetables. Fried vegetables. Cabbage, broccoli,  Brussels sprouts, artichokes, baked beans, beet greens, corn, kale, legumes, peas, sweet potatoes, and yams. Potato skins. Cooked spinach and cabbage. Fruits Dried fruit, including raisins and dates. Raw fruits. Stewed or dried prunes. Canned fruits with syrup. Meat and other protein foods Fried or fatty meats. Deli meats. Chunky nut butters. Nuts and seeds. Beans and lentils. Berniece Salines. Hot dogs. Sausage. Dairy High-fat cheeses. Whole milk, chocolate milk, and beverages made with milk, such as milk shakes. Half-and-half. Cream. sour cream. Ice cream. Beverages Caffeinated beverages (such as coffee, tea, soda, or energy drinks). Alcoholic beverages. Fruit juices with pulp. Prune juice. Soft drinks sweetened with high-fructose corn syrup or sugar alcohols. High-calorie sports drinks. Fats and oils Butter. Cream sauces. Margarine. Salad oils. Plain salad dressings. Olives. Avocados. Mayonnaise. Sweets and desserts Sweet rolls, doughnuts, and sweet breads. Sugar-free desserts sweetened with sugar alcohols such as xylitol and sorbitol. Seasoning and other foods Honey. Hot sauce. Chili powder. Gravy. Cream-based or milk-based soups. Pancakes and waffles. Summary  When you have diarrhea, the foods you  eat and your eating habits are very important.  Make sure you get at least 8-10 cups of fluid each day, or enough to keep your urine clear or pale yellow.  Eat bland foods and gradually reintroduce healthy, nutrient-rich foods as tolerated, or as told by your health care provider.  Avoid high-fiber, fried, greasy, or spicy foods. This information is not intended to replace advice given to you by your health care provider. Make sure you discuss any questions you have with your health care provider. Document Released: 02/19/2004 Document Revised: 11/26/2016 Document Reviewed: 11/26/2016 Elsevier Interactive Patient Education  2019 Elsevier Inc. Diarrhea, Adult Diarrhea is when you pass loose and watery  poop (stool) often. Diarrhea can make you feel weak and cause you to lose water in your body (get dehydrated). Losing water in your body can cause you to:  Feel tired and thirsty.  Have a dry mouth.  Go pee (urinate) less often. Diarrhea often lasts 2-3 days. However, it can last longer if it is a sign of something more serious. It is important to treat your diarrhea as told by your doctor. Follow these instructions at home: Eating and drinking     Follow these instructions as told by your doctor:  Take an ORS (oral rehydration solution). This is a drink that helps you replace fluids and minerals your body lost. It is sold at pharmacies and stores.  Drink plenty of fluids, such as: ? Water. ? Ice chips. ? Diluted fruit juice. ? Low-calorie sports drinks. ? Milk, if you want.  Avoid drinking fluids that have a lot of sugar or caffeine in them.  Eat bland, easy-to-digest foods in small amounts as you are able. These foods include: ? Bananas. ? Applesauce. ? Rice. ? Low-fat (lean) meats. ? Toast. ? Crackers.  Avoid alcohol.  Avoid spicy or fatty foods.  Medicines  Take over-the-counter and prescription medicines only as told by your doctor.  If you were prescribed an antibiotic medicine, take it as told by your doctor. Do not stop using the antibiotic even if you start to feel better. General instructions   Wash your hands often using soap and water. If soap and water are not available, use a hand sanitizer. Others in your home should wash their hands as well. Hands should be washed: ? After using the toilet or changing a diaper. ? Before preparing, cooking, or serving food. ? While caring for a sick person. ? While visiting someone in a hospital.  Drink enough fluid to keep your pee (urine) pale yellow.  Rest at home while you get better.  Watch your condition for any changes.  Take a warm bath to help with any burning or pain from having diarrhea.  Keep all  follow-up visits as told by your doctor. This is important. Contact a doctor if:  You have a fever.  Your diarrhea gets worse.  You have new symptoms.  You cannot keep fluids down.  You feel light-headed or dizzy.  You have a headache.  You have muscle cramps. Get help right away if:  You have chest pain.  You feel very weak or you pass out (faint).  You have bloody or black poop or poop that looks like tar.  You have very bad pain, cramping, or bloating in your belly (abdomen).  You have trouble breathing or you are breathing very quickly.  Your heart is beating very quickly.  Your skin feels cold and clammy.  You feel confused.  You have  signs of losing too much water in your body, such as: ? Dark pee, very little pee, or no pee. ? Cracked lips. ? Dry mouth. ? Sunken eyes. ? Sleepiness. ? Weakness. Summary  Diarrhea is when you pass loose and watery poop (stool) often.  Diarrhea can make you feel weak and cause you to lose water in your body (get dehydrated).  Take an ORS (oral rehydration solution). This is a drink that is sold at pharmacies and stores.  Eat bland, easy-to-digest foods in small amounts as you are able.  Contact a doctor if your condition gets worse. Get help right away if you have signs that you have lost too much water in your body. This information is not intended to replace advice given to you by your health care provider. Make sure you discuss any questions you have with your health care provider. Document Released: 05/17/2008 Document Revised: 05/05/2018 Document Reviewed: 05/05/2018 Elsevier Interactive Patient Education  2019 Elsevier Inc.   Bloody Diarrhea Bloody diarrhea is frequent loose and watery bowel movements that contain blood. The blood can be hard to see or notice (occult). Bloody diarrhea may be caused by medical conditions such as:  Ulcerative colitis.  Crohn's disease.  Intestinal infection.  Viral  gastroenteritis or bacterial gastroenteritis. Finding out why there is blood in your diarrhea is necessary so that your health care provider can prescribe the right treatment for you. Follow the instructions from your health care provider about treating the cause of your bloody diarrhea. Any type of diarrhea can make you feel weak and dehydrated. Dehydration can make you tired and thirsty, cause you to have a dry mouth, and decrease how often you urinate. Follow these instructions at home: Eating and drinking     Follow these recommendations as told by your health care provider:  Take an oral rehydration solution (ORS). This is an over-the-counter medicine that helps return your body to its normal balance of nutrients and water. It is found at pharmacies and retail stores.  Drink enough fluid to keep your urine pale yellow. ? Drink fluids such as water, ice chips, diluted fruit juice, and low-calorie sports drinks. You can also drink milk products, if desired. ? Avoid drinking fluids that contain a lot of sugar or caffeine, such as energy drinks, regular sports drinks, and soda. ? Avoid alcohol.  Eat bland, easy-to-digest foods in small amounts as you are able. These foods include bananas, applesauce, rice, lean meats, toast, and crackers.  Avoid spicy or fatty foods.  Medicines  Take over-the-counter and prescription medicines only as told by your health care provider. ? Your health care provider may prescribe medicine to slow down the frequency of diarrhea or to ease stomach discomfort.  If you were prescribed an antibiotic medicine, take it as told by your health care provider. Do not stop using the antibiotic even if you start to feel better. General instructions   Wash your hands often using soap and water. If soap and water are not available, use a hand sanitizer. Others in the household should wash their hands as well. Hands should be washed: ? After using the toilet or changing  a diaper. ? Before preparing, cooking, or serving food. ? While caring for a sick person or while visiting someone in a hospital.  Rest at home while you recover.  Take a warm bath to relieve any burning or pain from frequent diarrhea episodes.  Watch your condition for any changes.  Keep all follow-up visits as  told by your health care provider. This is important. Contact a health care provider if:  You have a fever.  Your diarrhea gets worse.  You have new symptoms.  You cannot keep fluids down.  You feel light-headed or dizzy.  You have a headache.  You have muscle cramps. Get help right away if:  You have chest pain.  You feel extremely weak or you faint.  The blood in your diarrhea increases or turns a different color.  You vomit and the vomit is bloody or looks black.  You have persistent diarrhea.  You have severe pain, cramping, or bloating in your abdomen.  You have trouble breathing or you are breathing very quickly.  Your heart is beating very quickly.  Your skin feels cold and clammy.  You feel confused.  You have signs of dehydration, such as: ? Dark urine, very little urine, or no urine. ? Cracked lips. ? Dry mouth. ? Sunken eyes. ? Sleepiness. ? Weakness. Summary  Bloody diarrhea is frequent loose and watery bowel movements that contain blood. The blood can be hard to see or notice (occult).  Follow the instructions from your health care provider about treating the cause of your bloody diarrhea.  Any type of diarrhea can make you feel weak and dehydrated.  Follow your health care provider's recommendations for eating and drinking and for taking medicines.  Contact your health care provider if your symptoms get worse. Get help right away if you have signs of dehydration. This information is not intended to replace advice given to you by your health care provider. Make sure you discuss any questions you have with your health care  provider. Document Released: 11/29/2005 Document Revised: 05/11/2018 Document Reviewed: 05/11/2018 Elsevier Interactive Patient Education  2019 [employer].

## 2019-05-18 ENCOUNTER — Telehealth: Payer: Self-pay | Admitting: Adult Health

## 2019-05-18 ENCOUNTER — Encounter: Payer: Self-pay | Admitting: Adult Health

## 2019-05-18 ENCOUNTER — Ambulatory Visit: Payer: Managed Care, Other (non HMO) | Admitting: Adult Health

## 2019-05-18 DIAGNOSIS — T4995XA Adverse effect of unspecified topical agent, initial encounter: Secondary | ICD-10-CM

## 2019-05-18 DIAGNOSIS — R238 Other skin changes: Secondary | ICD-10-CM | POA: Diagnosis not present

## 2019-05-18 LAB — COMPREHENSIVE METABOLIC PANEL
ALT: 54 IU/L — ABNORMAL HIGH (ref 0–44)
AST: 40 IU/L (ref 0–40)
Albumin/Globulin Ratio: 1.5 (ref 1.2–2.2)
Albumin: 4.6 g/dL (ref 3.8–4.9)
Alkaline Phosphatase: 86 IU/L (ref 39–117)
BUN/Creatinine Ratio: 14 (ref 9–20)
BUN: 11 mg/dL (ref 6–24)
Bilirubin Total: 0.3 mg/dL (ref 0.0–1.2)
CO2: 21 mmol/L (ref 20–29)
Calcium: 9.6 mg/dL (ref 8.7–10.2)
Chloride: 98 mmol/L (ref 96–106)
Creatinine, Ser: 0.79 mg/dL (ref 0.76–1.27)
GFR calc Af Amer: 118 mL/min/{1.73_m2} (ref >59)
GFR calc non Af Amer: 102 mL/min/{1.73_m2} (ref >59)
Globulin, Total: 3 g/dL (ref 1.5–4.5)
Glucose: 244 mg/dL — ABNORMAL HIGH (ref 65–99)
Potassium: 3.9 mmol/L (ref 3.5–5.2)
Sodium: 136 mmol/L (ref 134–144)
Total Protein: 7.6 g/dL (ref 6.0–8.5)

## 2019-05-18 LAB — CBC WITH DIFFERENTIAL/PLATELET
Basophils Absolute: 0.1 10*3/uL (ref 0.0–0.2)
Basos: 1 %
EOS (ABSOLUTE): 0.1 10*3/uL (ref 0.0–0.4)
Eos: 1 %
Hematocrit: 42.5 % (ref 37.5–51.0)
Hemoglobin: 15.4 g/dL (ref 13.0–17.7)
Immature Grans (Abs): 0 10*3/uL (ref 0.0–0.1)
Immature Granulocytes: 1 %
Lymphocytes Absolute: 2 10*3/uL (ref 0.7–3.1)
Lymphs: 31 %
MCH: 30.8 pg (ref 26.6–33.0)
MCHC: 36.2 g/dL — ABNORMAL HIGH (ref 31.5–35.7)
MCV: 85 fL (ref 79–97)
Monocytes Absolute: 0.4 10*3/uL (ref 0.1–0.9)
Monocytes: 7 %
Neutrophils Absolute: 3.7 10*3/uL (ref 1.4–7.0)
Neutrophils: 59 %
Platelets: 172 10*3/uL (ref 150–450)
RBC: 5 x10E6/uL (ref 4.14–5.80)
RDW: 13.3 % (ref 11.6–15.4)
WBC: 6.3 10*3/uL (ref 3.4–10.8)

## 2019-05-18 LAB — AMYLASE: Amylase: 35 U/L (ref 31–110)

## 2019-05-18 LAB — LIPASE: Lipase: 29 U/L (ref 13–78)

## 2019-05-18 LAB — SPECIMEN STATUS REPORT

## 2019-05-18 MED ORDER — AZITHROMYCIN 500 MG PO TABS: 500.0000 mg | ORAL_TABLET | Freq: Every day | ORAL | 0 refills | Status: AC

## 2019-05-18 MED ORDER — MUPIROCIN 2 % EX OINT: 1.0000 "application " | TOPICAL_OINTMENT | Freq: Three times a day (TID) | CUTANEOUS | 0 refills | Status: DC

## 2019-05-18 MED ORDER — HYDROCORTISONE 0.5 % EX OINT: 1.0000 "application " | TOPICAL_OINTMENT | Freq: Two times a day (BID) | CUTANEOUS | 0 refills | Status: DC

## 2019-05-18 NOTE — Progress Notes (Signed)
Patient ID: Luis Hatfield., male   DOB: 1966-03-30, 53 y.o.   MRN: [id number] Spoke with [facility] added on stat lipase and amylase.

## 2019-05-18 NOTE — Patient Instructions (Signed)
Contact Dermatitis  Dermatitis is redness, soreness, and swelling (inflammation) of the skin. Contact dermatitis is a reaction to something that touches the skin.  There are two types of contact dermatitis:   Irritant contact dermatitis. This happens when something bothers (irritates) your skin, like soap.   Allergic contact dermatitis. This is caused when you are exposed to something that you are allergic to, such as poison ivy.  What are the causes?   Common causes of irritant contact dermatitis include:  ? Makeup.  ? Soaps.  ? Detergents.  ? Bleaches.  ? Acids.  ? Metals, such as nickel.   Common causes of allergic contact dermatitis include:  ? Plants.  ? Chemicals.  ? Jewelry.  ? Latex.  ? Medicines.  ? Preservatives in products, such as clothing.  What increases the risk?   Having a job that exposes you to things that bother your skin.   Having asthma or eczema.  What are the signs or symptoms?  Symptoms may happen anywhere the irritant has touched your skin. Symptoms include:   Dry or flaky skin.   Redness.   Cracks.   Itching.   Pain or a burning feeling.   Blisters.   Blood or clear fluid draining from skin cracks.  With allergic contact dermatitis, swelling may occur. This may happen in places such as the eyelids, mouth, or genitals.  How is this treated?   This condition is treated by checking for the cause of the reaction and protecting your skin. Treatment may also include:  ? Steroid creams, ointments, or medicines.  ? Antibiotic medicines or other ointments, if you have a skin infection.  ? Lotion or medicines to help with itching.  ? A bandage (dressing).  Follow these instructions at home:  Skin care   Moisturize your skin as needed.   Put cool cloths on your skin.   Put a baking soda paste on your skin. Stir water into baking soda until it looks like a paste.   Do not scratch your skin.   Avoid having things rub up against your skin.   Avoid the use of soaps, perfumes, and  dyes.  Medicines   Take or apply over-the-counter and prescription medicines only as told by your doctor.   If you were prescribed an antibiotic medicine, take or apply it as told by your doctor. Do not stop using it even if your condition starts to get better.  Bathing   Take a bath with:  ? Epsom salts.  ? Baking soda.  ? Colloidal oatmeal.   Bathe less often.   Bathe in warm water. Avoid using hot water.  Bandage care   If you were given a bandage, change it as told by your health care provider.   Wash your hands with soap and water before and after you change your bandage. If soap and water are not available, use hand sanitizer.  General instructions   Avoid the things that caused your reaction. If you do not know what caused it, keep a journal. Write down:  ? What you eat.  ? What skin products you use.  ? What you drink.  ? What you wear in the area that has symptoms. This includes jewelry.   Check the affected areas every day for signs of infection. Check for:  ? More redness, swelling, or pain.  ? More fluid or blood.  ? Warmth.  ? Pus or a bad smell.   Keep all follow-up visits as   told by your doctor. This is important.  Contact a doctor if:   You do not get better with treatment.   Your condition gets worse.   You have signs of infection, such as:  ? More swelling.  ? Tenderness.  ? More redness.  ? Soreness.  ? Warmth.   You have a fever.   You have new symptoms.  Get help right away if:   You have a very bad headache.   You have neck pain.   Your neck is stiff.   You throw up (vomit).   You feel very sleepy.   You see red streaks coming from the area.   Your bone or joint near the area hurts after the skin has healed.   The area turns darker.   You have trouble breathing.  Summary   Dermatitis is redness, soreness, and swelling of the skin.   Symptoms may occur where the irritant has touched you.   Treatment may include medicines and skin care.   If you do not know what caused  your reaction, keep a journal.   Contact a doctor if your condition gets worse or you have signs of infection.  This information is not intended to replace advice given to you by your health care provider. Make sure you discuss any questions you have with your health care provider.  Document Released: 09/26/2009 Document Revised: 06/14/2018 Document Reviewed: 06/14/2018  Elsevier Interactive Patient Education  2019 Elsevier Inc.

## 2019-05-18 NOTE — Telephone Encounter (Addendum)
05/18/19 called patient to review lab results. No answer on home or cell. Left message to return call to clinic.   05/19/19 he returned call to the clinic 200pm  Follow up on how he is feeling- he reports he had diarrhea once this morning, denies any blood. Denies black or tarry bowels.   No signs of infection or anemia in his CBC.   Was he fasting at time of glucose, glucose was 244 he had drank orange juice, has he seen PCP since seeing Dr. Burnett Harry. He is up to 32 units of lantus , and taking glipizide 10 mg. He reports he did not take his medications for two days prior to these labs.   ALT 54 , denies tylenol, denies any alcohol, he is on Lipitor for cholesterol.  Amylase and lipase was ordered this morning add on and are resulted as both in normal range.  Will advised  to avoid alcohol, tylenol.  He should see his primary care within 1 week and is advised to call today for appointment. He is to go to the emergency room and be seen should any symptoms worsen at anytime.  Patient verbalized understanding of all instructions given and denies any further questions at this time.

## 2019-05-18 NOTE — Progress Notes (Signed)
Virtual Visit via Telephone Note  I connected with Hiep Ollis. on 05/18/19 at  3:00 PM EDT by telephone and verified that I am speaking with the correct person using two identifiers.  Location: Patient: at his home  Provider: at [facility] clinic office    I discussed the limitations, risks, security and privacy concerns of performing an evaluation and management service by telephone and the availability of in person appointments. I also discussed with the patient that there may be a patient responsible charge related to this service. The patient expressed understanding and agreed to proceed.   History of Present Illness: Fever 99.2 per patient.  Patient is a 53 year old male in no acute distress who calls the clnic and prefers a phone evaluation/ My Chart screening.  He was seen in the clinic yesterday with a history of diarrhea since Tuesday. ( see last note) He reports today his symptoms have improved. Denies any exposures to poison oak or other known exposure. No chemical exposure.  Denies any injury.  He now has rash around his mouth to nasal labial. He used a Sebex this morning. He reports burning and stinging with the Sebex wash this morning. He reports area is sore to touch. Does not itch. Onset 1 day ago.   Patient  denies any fever, body aches,chills, rash, chest pain, shortness of breath, nausea, vomiting, or diarrhea.    He reports he has a history of dermatitis.    Observations/Objective:   Patient is alert and oriented and responsive to questions Engages in conversation with provider. Speaks in full sentences without any pauses without any shortness of breath or distress.   See picture of rash attached. Patchy erythematous macular area surrounding his beard line and nasal labial folds as well.   Assessment and Plan: Skin irritation due to topical agent    Follow Up Instructions: He reports he did go for his Covid testing yesterday and understands  he is on a 10 day quarantine per work note. He stated he received it.    Discontinue Sebex use.  DO not shave.   Meds ordered this encounter  Medications  . mupirocin ointment (BACTROBAN) 2 %    Sig: Apply 1 application topically 3 (three) times daily. Apply to affected areas directed.    Dispense:  22 g    Refill:  0  . hydrocortisone ointment 0.5 %    Sig: Apply 1 application topically 2 (two) times daily.    Dispense:  30 g    Refill:  0   Recheck Monday 05/16/19 virtual visit.   I discussed the assessment and treatment plan with the patient. The patient was provided an opportunity to ask questions and all were answered. The patient agreed with the plan and demonstrated an understanding of the instructions.   Advised patient call the office or your primary care doctor for an appointment if no improvement within 72 hours or if any symptoms change or worsen at any time  Advised ER or urgent Care if after hours or on weekend. Call 911 for emergency symptoms at any time.Patinet verbalized understanding of all instructions given/reviewed and treatment plan and has no further questions or concerns at this time.     The patient was advised to call back or seek an in-person evaluation if the symptoms worsen or if the condition fails to improve as anticipated.  I provided 20 minutes of non-face-to-face time during this encounter.   Marcille Buffy, FNP

## 2019-05-19 LAB — NOVEL CORONAVIRUS, NAA: SARS-CoV-2, NAA: NOT DETECTED

## 2019-05-21 ENCOUNTER — Encounter: Payer: Self-pay | Admitting: *Deleted

## 2019-05-21 NOTE — Progress Notes (Addendum)
Ref Range & Units 4d ago  SARS-CoV-2, NAA Not Detected Not Detected     Covid not detected. Based on symptoms patient will be advised to remain out of work at least 10 days and 72 hours fever free with all symptoms improving.  Can not rule out false negative. Work note provided

## 2019-06-14 ENCOUNTER — Other Ambulatory Visit: Payer: Self-pay

## 2019-06-14 ENCOUNTER — Encounter: Payer: Self-pay | Admitting: Gastroenterology

## 2019-06-14 ENCOUNTER — Ambulatory Visit (INDEPENDENT_AMBULATORY_CARE_PROVIDER_SITE_OTHER): Payer: Managed Care, Other (non HMO) | Admitting: Gastroenterology

## 2019-06-14 VITALS — BP 119/76 | HR 92 | Temp 98.3°F | Ht 71.0 in | Wt 262.5 lb

## 2019-06-14 DIAGNOSIS — R112 Nausea with vomiting, unspecified: Secondary | ICD-10-CM

## 2019-06-14 DIAGNOSIS — K582 Mixed irritable bowel syndrome: Secondary | ICD-10-CM

## 2019-06-14 DIAGNOSIS — K921 Melena: Secondary | ICD-10-CM | POA: Diagnosis not present

## 2019-06-14 MED ORDER — CHOLESTYRAMINE 4 G PO PACK: 4.0000 g | PACK | Freq: Three times a day (TID) | ORAL | 12 refills | Status: DC

## 2019-06-14 NOTE — Patient Instructions (Addendum)
You are scheduled for a gastric emptying study at [facility] on Thursday, July 16th at 11:00am. Please arrive at the medical mall registration desk at 10:30.am. You cannot have anything eat or drink 4 hours prior. No stomach medications such as reflux, nausea or pain medications 8 hours prior.    If you need to reschedule this appointment for any reason, please contact central scheduling at [phone].

## 2019-06-14 NOTE — Progress Notes (Signed)
Gastroenterology Consultation  Referring Provider:     Sharmon Leyden* Primary Care Physician:  [facility] Primary Gastroenterologist:  Dr. Allen Norris     Reason for Consultation:     Change in bowel habits        HPI:   Luis Hatfield. is a 53 y.o. y/o male referred for consultation & management of change in bowel habits by Dr. Domingo [facility].  This patient comes in today after being seen by the  [facility] clinic GI team back in 2017 for rectal bleeding and change in bowel habits.  The patient at that time had reported that his stools were the consistency of soft serve ice cream and other stools were hard and difficult to pass.  The patient also had reported that had a colonoscopy was in 2013 with findings of internal hemorrhoids.  He was sent home from the [facility] clinic with Anusol suppositories. The patient has a documented history of a father with adenomatous polyps. The patient reports that he had repeat colonoscopy in 2017.  The patient has also reported that his most recent attack was earlier this month with rectal bleeding and profuse diarrhea that has since resolved.  The patient states that now he is back to his alternating diarrhea and constipation.  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  The patient also reports that he had his gallbladder removed in the past. The patient reports that he also has very foul-smelling burps and he says that it smells like sulfur.  There is no report of any unexplained weight loss fevers chills nausea or vomiting.   Past Medical History:  Diagnosis Date  . Diabetes mellitus without complication ([place])   . Hyperlipidemia   . Hypertension     Past Surgical History:  Procedure Laterality Date  . CHOLECYSTECTOMY  2007  .  COLONOSCOPY  2017  . TONSILLECTOMY  1977    Prior to Admission medications   Medication Sig Start Date End Date Taking? Authorizing Provider  atorvastatin (LIPITOR) 80 MG tablet  01/13/19   [provider]  glipiZIDE (GLUCOTROL) 10 MG tablet glipizide 10 mg tablet    [provider]  hydrocortisone ointment 0.5 % Apply 1 application topically 2 (two) times daily. 05/18/19   Flinchum, Kelby Aline, FNP  ibuprofen (ADVIL,MOTRIN) 200 MG tablet Take by mouth.    [provider]  LANTUS 100 UNIT/ML injection  12/09/18   [provider]  metFORMIN (GLUCOPHAGE) 1000 MG tablet metformin 1,000 mg tablet    [provider]  mupirocin ointment (BACTROBAN) 2 % Apply 1 application topically 3 (three) times daily. Apply to affected areas directed. 05/18/19   Flinchum, Kelby Aline, FNP  omeprazole (PRILOSEC) 20 MG capsule  01/13/19   [provider]  selenium sulfide (SELSUN) 2.5 % shampoo selenium sulfide 2.5 % lotion    [provider]  sertraline (ZOLOFT) 100 MG tablet Take 100 mg by mouth daily.    [provider]  simvastatin (ZOCOR) 10 MG tablet Take 10 mg by mouth daily.    [provider]    Family History  Problem Relation Age of Onset  . Lung cancer Mother   . Colon polyps Father   . Parkinson's disease Father   .  Lung cancer Sister      Social History   Tobacco Use  . Smoking status: Never Smoker  . Smokeless tobacco: Never Used  Substance Use Topics  . Alcohol use: Yes    Alcohol/week: 0.0 standard drinks    Frequency: Never  . Drug use: No    Allergies as of 06/14/2019  . (No Known Allergies)    Review of Systems:    All systems reviewed and negative except where noted in HPI.   Physical Exam:  There were no vitals taken for this visit. No LMP for male patient. General:   Alert,  Well-developed, well-nourished, pleasant and cooperative in NAD Head:  Normocephalic and atraumatic. Eyes:  Sclera clear,  no icterus.   Conjunctiva pink. Ears:  Normal auditory acuity. Nose:  No deformity, discharge, or lesions. Mouth:  No deformity or lesions,oropharynx pink & moist. Neck:  Supple; no masses or thyromegaly. Lungs:  Respirations even and unlabored.  Clear throughout to auscultation.   No wheezes, crackles, or rhonchi. No acute distress. Heart:  Regular rate and rhythm; no murmurs, clicks, rubs, or gallops. Abdomen:  Normal bowel sounds.  No bruits.  Soft, non-tender and non-distended without masses, hepatosplenomegaly or hernias noted.  No guarding or rebound tenderness.  Negative Carnett sign.   Rectal:  Deferred.  Msk:  Symmetrical without gross deformities.  Good, equal movement & strength bilaterally. Pulses:  Normal pulses noted. Extremities:  No clubbing or edema.  No cyanosis. Neurologic:  Alert and oriented x3;  grossly normal neurologically. Skin:  Intact without significant lesions or rashes.  No jaundice. Lymph Nodes:  No significant cervical adenopathy. Psych:  Alert and cooperative. Normal mood and affect.  Imaging Studies: No results found.  Assessment and Plan:   Luis Hatfield. is a 53 y.o. y/o male who comes in today with a history of diarrhea that he reported to be bloody at the beginning of this month.  The patient states his symptoms have resolved but he was still concerned about it.  The patient has had alternating diarrhea and constipation in the past with the rectal bleeding.  His colonoscopies have also only shown internal hemorrhoids.  The patient likely suffers from irritable bowel syndrome with rectal bleeding from his hemorrhoids.  The patient has been told to start fiber once to twice a day to see if this helps with his stool consistency.  The patient will also be started on a trial of Questran.  The patient will also have a gastric emptying study ordered as well as a hydrogen breath test for bacterial overgrowth and a breath test for H. pylori for his  dyspepsia.  If these do not work the patient can always be tried on Lomotil or dicyclomine.  The patient has been explained the plan and agrees with it  [name], MD. Marval Regal    Note: This dictation was prepared with Dragon dictation along with smaller phrase technology. Any transcriptional errors that result from this process are unintentional.

## 2019-06-22 ENCOUNTER — Encounter: Payer: Self-pay | Admitting: Adult Health

## 2019-06-25 NOTE — Telephone Encounter (Signed)
Just fyi.

## 2019-06-26 ENCOUNTER — Other Ambulatory Visit: Payer: Self-pay

## 2019-06-28 ENCOUNTER — Other Ambulatory Visit: Payer: Self-pay

## 2019-06-28 ENCOUNTER — Encounter
Admission: RE | Admit: 2019-06-28 | Discharge: 2019-06-28 | Disposition: A | Discharge status: Home / Self Care | Payer: Managed Care, Other (non HMO) | Source: Ambulatory Visit | Attending: Gastroenterology | Admitting: Gastroenterology

## 2019-06-28 DIAGNOSIS — R112 Nausea with vomiting, unspecified: Secondary | ICD-10-CM | POA: Insufficient documentation

## 2019-06-28 MED ORDER — TECHNETIUM TC 99M SULFUR COLLOID
2.0000 | Freq: Once | INTRAVENOUS | Status: AC | PRN
  Administered 2019-06-28: 2.882 via ORAL

## 2019-07-03 ENCOUNTER — Telehealth: Payer: Self-pay

## 2019-07-03 NOTE — Telephone Encounter (Signed)
-----   Message from [name], MD sent at 07/02/2019  6:57 AM EDT ----- Let the patient know that his gastric emptying study was completely normal.

## 2019-07-03 NOTE — Telephone Encounter (Signed)
Pt notified of gastric emptying study results.  

## 2019-07-16 ENCOUNTER — Telehealth: Payer: Self-pay | Admitting: Adult Health

## 2019-07-16 NOTE — Telephone Encounter (Signed)
Patient called the clinic at 11:30 am on 07/16/19 with complaints of vomiting and diarrhea x 4 days, he reports he is shakes and his legs are very weak. Denies abdominal pain. Not able to keep food/ liquids down. He is advised to be seen in emergency room now. he should follow up afterwards with his primary care provider and with his gastroenterologist as he has a history of chronic nausea and vomiting. He is advised not to self drive. Unable to see patient in this clinic due to Covid protocol. .Call 911 if no one able to drive patient due to symptoms.  Patient  denies any fever, body aches,chills, rash, chest pain, shortness of breath, or diarrhea.   Patient verbalized understanding of all instructions given and denies any further questions at this time.

## 2019-07-23 ENCOUNTER — Telehealth: Payer: Self-pay | Admitting: Gastroenterology

## 2019-07-23 NOTE — Telephone Encounter (Signed)
Spoke with Barnabas Lister from [place] and ICD 10 codes were given.

## 2019-07-23 NOTE — Telephone Encounter (Signed)
Larkin Ina from [place] left vm stating he is missing ICD 10 codes on pt breath test please call [phone]

## 2019-07-24 ENCOUNTER — Encounter: Payer: Self-pay | Admitting: Gastroenterology

## 2019-07-25 ENCOUNTER — Telehealth: Payer: Self-pay | Admitting: Gastroenterology

## 2019-07-25 NOTE — Telephone Encounter (Signed)
Luis Hatfield with [facility] called & l/m on v/m about patient. He states he had faxed the results [facility] blood results & they showed gas values where flat & they like to confirm the patient drank the glucose along with this matching the clinical questions. Please call if questions [phone].

## 2019-07-25 NOTE — Telephone Encounter (Signed)
Mychart message sent to pt regarding test results.

## 2019-07-31 ENCOUNTER — Telehealth: Payer: Self-pay

## 2019-07-31 NOTE — Telephone Encounter (Signed)
Pt notified of results via MyChart.

## 2019-07-31 NOTE — Telephone Encounter (Signed)
-----   Message from [name], MD sent at 07/28/2019 10:36 AM EDT ----- The patient know that his breath test did not show any sign of bacterial overgrowth.

## 2019-10-02 ENCOUNTER — Encounter: Payer: Self-pay | Admitting: Adult Health

## 2019-10-21 ENCOUNTER — Encounter: Payer: Self-pay | Admitting: Nurse Practitioner

## 2019-10-21 DIAGNOSIS — E781 Pure hyperglyceridemia: Secondary | ICD-10-CM | POA: Insufficient documentation

## 2019-10-21 DIAGNOSIS — E1165 Type 2 diabetes mellitus with hyperglycemia: Secondary | ICD-10-CM | POA: Insufficient documentation

## 2019-10-21 DIAGNOSIS — G4733 Obstructive sleep apnea (adult) (pediatric): Secondary | ICD-10-CM | POA: Insufficient documentation

## 2019-10-21 DIAGNOSIS — E785 Hyperlipidemia, unspecified: Secondary | ICD-10-CM | POA: Insufficient documentation

## 2019-10-21 DIAGNOSIS — E1169 Type 2 diabetes mellitus with other specified complication: Secondary | ICD-10-CM | POA: Insufficient documentation

## 2019-10-26 ENCOUNTER — Ambulatory Visit (INDEPENDENT_AMBULATORY_CARE_PROVIDER_SITE_OTHER): Payer: Managed Care, Other (non HMO) | Admitting: Nurse Practitioner

## 2019-10-26 ENCOUNTER — Other Ambulatory Visit: Payer: Self-pay

## 2019-10-26 ENCOUNTER — Encounter: Payer: Self-pay | Admitting: Nurse Practitioner

## 2019-10-26 VITALS — BP 135/76 | HR 84 | Temp 99.7°F | Ht 71.0 in | Wt 260.0 lb

## 2019-10-26 DIAGNOSIS — E1165 Type 2 diabetes mellitus with hyperglycemia: Secondary | ICD-10-CM

## 2019-10-26 DIAGNOSIS — G4733 Obstructive sleep apnea (adult) (pediatric): Secondary | ICD-10-CM

## 2019-10-26 DIAGNOSIS — K219 Gastro-esophageal reflux disease without esophagitis: Secondary | ICD-10-CM

## 2019-10-26 DIAGNOSIS — Z6836 Body mass index (BMI) 36.0-36.9, adult: Secondary | ICD-10-CM

## 2019-10-26 DIAGNOSIS — E1169 Type 2 diabetes mellitus with other specified complication: Secondary | ICD-10-CM | POA: Diagnosis not present

## 2019-10-26 DIAGNOSIS — F4312 Post-traumatic stress disorder, chronic: Secondary | ICD-10-CM | POA: Diagnosis not present

## 2019-10-26 DIAGNOSIS — E781 Pure hyperglyceridemia: Secondary | ICD-10-CM

## 2019-10-26 DIAGNOSIS — R69 Illness, unspecified: Secondary | ICD-10-CM

## 2019-10-26 DIAGNOSIS — E785 Hyperlipidemia, unspecified: Secondary | ICD-10-CM

## 2019-10-26 NOTE — Assessment & Plan Note (Signed)
Chronic, stable.  Continue Prilosec and monitor Mag level annually.

## 2019-10-26 NOTE — Assessment & Plan Note (Signed)
Referral to [facility] placed.

## 2019-10-26 NOTE — Assessment & Plan Note (Signed)
Continue current medication regimen and collaboration with [facility].  Obtain labs next visit.

## 2019-10-26 NOTE — Assessment & Plan Note (Signed)
Chronic, ongoing poorly controlled.  Continued current medication regimen and consider collaboration with CCM team in office to assist with collaboration of care between [place] and this office + assist in obtaining enhanced control of diabetes.  Could consider GLP or SGLT, dependent on what is on [place].  Return in 4 weeks for follow-up.

## 2019-10-26 NOTE — Assessment & Plan Note (Signed)
Continue collaboration with [employer].

## 2019-10-26 NOTE — Patient Instructions (Signed)
Carbohydrate Counting for Diabetes Mellitus, Adult  Carbohydrate counting is a method of keeping track of how many carbohydrates you eat. Eating carbohydrates naturally increases the amount of sugar (glucose) in the blood. Counting how many carbohydrates you eat helps keep your blood glucose within normal limits, which helps you manage your diabetes (diabetes mellitus). It is important to know how many carbohydrates you can safely have in each meal. This is different for every person. A diet and nutrition specialist (registered dietitian) can help you make a meal plan and calculate how many carbohydrates you should have at each meal and snack. Carbohydrates are found in the following foods:  Grains, such as breads and cereals.  Dried beans and soy products.  Starchy vegetables, such as potatoes, peas, and corn.  Fruit and fruit juices.  Milk and yogurt.  Sweets and snack foods, such as cake, cookies, candy, chips, and soft drinks. How do I count carbohydrates? There are two ways to count carbohydrates in food. You can use either of the methods or a combination of both. Reading "Nutrition Facts" on packaged food The "Nutrition Facts" list is included on the labels of almost all packaged foods and beverages in the [place] It includes:  The serving size.  Information about nutrients in each serving, including the grams (g) of carbohydrate per serving. To use the "Nutrition Facts":  Decide how many servings you will have.  Multiply the number of servings by the number of carbohydrates per serving.  The resulting number is the total amount of carbohydrates that you will be having. Learning standard serving sizes of other foods When you eat carbohydrate foods that are not packaged or do not include "Nutrition Facts" on the label, you need to measure the servings in order to count the amount of carbohydrates:  Measure the foods that you will eat with a food scale or measuring cup, if needed.   Decide how many standard-size servings you will eat.  Multiply the number of servings by 15. Most carbohydrate-rich foods have about 15 g of carbohydrates per serving. ? For example, if you eat 8 oz (170 g) of strawberries, you will have eaten 2 servings and 30 g of carbohydrates (2 servings x 15 g = 30 g).  For foods that have more than one food mixed, such as soups and casseroles, you must count the carbohydrates in each food that is included. The following list contains standard serving sizes of common carbohydrate-rich foods. Each of these servings has about 15 g of carbohydrates:   hamburger bun or  English muffin.   oz (15 mL) syrup.   oz (14 g) jelly.  1 slice of bread.  1 six-inch tortilla.  3 oz (85 g) cooked rice or pasta.  4 oz (113 g) cooked dried beans.  4 oz (113 g) starchy vegetable, such as peas, corn, or potatoes.  4 oz (113 g) hot cereal.  4 oz (113 g) mashed potatoes or  of a large baked potato.  4 oz (113 g) canned or frozen fruit.  4 oz (120 mL) fruit juice.  4-6 crackers.  6 chicken nuggets.  6 oz (170 g) unsweetened dry cereal.  6 oz (170 g) plain fat-free yogurt or yogurt sweetened with artificial sweeteners.  8 oz (240 mL) milk.  8 oz (170 g) fresh fruit or one small piece of fruit.  24 oz (680 g) popped popcorn. Example of carbohydrate counting Sample meal  3 oz (85 g) chicken breast.  6 oz (170 g)   brown rice.  4 oz (113 g) corn.  8 oz (240 mL) milk.  8 oz (170 g) strawberries with sugar-free whipped topping. Carbohydrate calculation 1. Identify the foods that contain carbohydrates: ? Rice. ? Corn. ? Milk. ? Strawberries. 2. Calculate how many servings you have of each food: ? 2 servings rice. ? 1 serving corn. ? 1 serving milk. ? 1 serving strawberries. 3. Multiply each number of servings by 15 g: ? 2 servings rice x 15 g = 30 g. ? 1 serving corn x 15 g = 15 g. ? 1 serving milk x 15 g = 15 g. ? 1 serving  strawberries x 15 g = 15 g. 4. Add together all of the amounts to find the total grams of carbohydrates eaten: ? 30 g + 15 g + 15 g + 15 g = 75 g of carbohydrates total. Summary  Carbohydrate counting is a method of keeping track of how many carbohydrates you eat.  Eating carbohydrates naturally increases the amount of sugar (glucose) in the blood.  Counting how many carbohydrates you eat helps keep your blood glucose within normal limits, which helps you manage your diabetes.  A diet and nutrition specialist (registered dietitian) can help you make a meal plan and calculate how many carbohydrates you should have at each meal and snack. This information is not intended to replace advice given to you by your health care provider. Make sure you discuss any questions you have with your health care provider. Document Released: 11/29/2005 Document Revised: 06/23/2017 Document Reviewed: 05/12/2016 Elsevier Patient Education  2020 Elsevier Inc.  

## 2019-10-26 NOTE — Assessment & Plan Note (Addendum)
[facility]  Continue Sertraline and Prazosin daily + collaborate with [facility] and psychiat[facility].   

## 2019-10-26 NOTE — Assessment & Plan Note (Signed)
Chronic, ongoing.  Continue Simvastatin and Niacin + collaboration with [facility], labs next visit.

## 2019-10-26 NOTE — Assessment & Plan Note (Signed)
Recommend continued focus on health diet choices and regular physical activity (30 minutes 5 days a week). 
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
## 2019-10-26 NOTE — Assessment & Plan Note (Signed)
With BMI 36.26 and T2DM + HTN.  Recommend continued focus on health diet choices and regular physical activity (30 minutes 5 days a week).

## 2019-11-05 ENCOUNTER — Telehealth: Payer: Self-pay | Admitting: Licensed Clinical Social Worker

## 2019-11-06 ENCOUNTER — Ambulatory Visit: Payer: Self-pay | Admitting: Licensed Clinical Social Worker

## 2019-11-06 NOTE — Chronic Care Management (AMB) (Signed)
  Care Management   Follow Up Note   11/06/2019 Name: Luis Hatfield. MRN: [id number] DOB: Sep 19, 1966  Referred by: Venita Lick, NP Reason for referral : Care Coordination   Luis Phaneuf. is a 53 y.o. year old male who is a primary care patient of Cannady, Barbaraann Faster, NP. The care management team was consulted for assistance with care management and care coordination needs.    Review of patient status, including review of consultants reports, relevant laboratory and other test results, and collaboration with appropriate care team members and the patient's provider was performed as part of comprehensive patient evaluation and provision of chronic care management services.    LCSW completed CCM outreach attempt today but was unable to reach patient successfully. A HIPPA compliant voice message was left encouraging patient to return call once available. LCSW rescheduled CCM SW appointment as well.  A HIPPA compliant phone message was left for the patient providing contact information and requesting a return call.   Eula Fried, BSW, MSW, [facility] Care Management [email or web address] Phone: [phone]

## 2019-11-16 ENCOUNTER — Encounter: Payer: Self-pay | Admitting: Nurse Practitioner

## 2019-11-23 ENCOUNTER — Ambulatory Visit: Payer: Self-pay | Admitting: *Deleted

## 2019-11-23 ENCOUNTER — Encounter: Payer: Self-pay | Admitting: Nurse Practitioner

## 2019-11-23 ENCOUNTER — Ambulatory Visit (INDEPENDENT_AMBULATORY_CARE_PROVIDER_SITE_OTHER): Payer: Managed Care, Other (non HMO) | Admitting: Nurse Practitioner

## 2019-11-23 ENCOUNTER — Other Ambulatory Visit: Payer: Self-pay

## 2019-11-23 ENCOUNTER — Ambulatory Visit: Payer: Self-pay

## 2019-11-23 VITALS — BP 145/74 | HR 91 | Temp 99.5°F

## 2019-11-23 DIAGNOSIS — E1165 Type 2 diabetes mellitus with hyperglycemia: Secondary | ICD-10-CM

## 2019-11-23 DIAGNOSIS — Z6836 Body mass index (BMI) 36.0-36.9, adult: Secondary | ICD-10-CM

## 2019-11-23 DIAGNOSIS — E1169 Type 2 diabetes mellitus with other specified complication: Secondary | ICD-10-CM | POA: Diagnosis not present

## 2019-11-23 DIAGNOSIS — G8929 Other chronic pain: Secondary | ICD-10-CM

## 2019-11-23 DIAGNOSIS — R030 Elevated blood-pressure reading, without diagnosis of hypertension: Secondary | ICD-10-CM | POA: Insufficient documentation

## 2019-11-23 DIAGNOSIS — E785 Hyperlipidemia, unspecified: Secondary | ICD-10-CM

## 2019-11-23 MED ORDER — OZEMPIC (0.25 OR 0.5 MG/DOSE) 2 MG/1.5ML ~~LOC~~ SOPN: 0.2500 mg | PEN_INJECTOR | SUBCUTANEOUS | 3 refills | Status: DC

## 2019-11-23 NOTE — Assessment & Plan Note (Signed)
Chronic, ongoing.  Continue Simvastatin and Niacin + collaboration with [facility], to send his recent labs via [place].

## 2019-11-23 NOTE — Patient Instructions (Signed)
Thank you allowing the Chronic Care Management Team to be a part of your care! It was a pleasure speaking with you today!  CCM (Chronic Care Management) Team   Jaryn Rosko RN, BSN Nurse Care Coordinator  [phone]  [facility] PharmD  Clinical Pharmacist  [phone]  Eula Fried LCSW Clinical Social Worker [phone]  Goals Addressed            This Visit's Progress   . RN- I want to get healthy to stay around a long time for my daughter (pt-stated)       Current Barriers:  . Chronic Disease Management support, education, and care coordination needs related to HLD, Hypertriglyceridemia, COPD, DMII, Anxiety, Depression, and Back Pain   Clinical Goal(s) related to HLD, Hypertriglyceridemia, COPD, DMII, Anxiety, Depression, and Back Pain  :  Over the next 90 days, patient will:  . Work with the care management team to address educational, disease management, and care coordination needs  . Begin or continue self health monitoring activities as directed today Measure and record cbg (blood glucose) one  times daily . Call provider office for new or worsened signs and symptoms Blood glucose findings outside established parameters . Call care management team with questions or concerns . Verbalize basic understanding of patient centered plan of care established today  Interventions related to HLD, Hypertriglyceridemia, COPD, DMII, Anxiety, and Depression:  . Evaluation of current treatment plans and patient's adherence to plan as established by provider . Assessed patient understanding of disease states . Assessed patient's education and care coordination needs . Provided disease specific education to patient  . Collaborated with appropriate clinical care team members regarding patient needs . Met with patient in person at PCP office.  . Discussed goals and patient is trying to reduce portions and stay healthy until retires in June.  . Plate method teaching done.  Discussed the difficulty of cooking for 1 since he has been separated from spouse   . Encouraged patient to exercise, he currently is walking his dog and taking walks with his daughter. He is planning to obtain a bike.   Patient Self Care Activities related to HLD, Hypertriglyceridemia, COPD, DMII, Anxiety, Depression, and Back Pain  :  . Patient is unable to independently self-manage chronic health conditions  Initial goal documentation        The patient verbalized understanding of instructions provided today and declined a print copy of patient instruction materials.   The patient has been provided with contact information for the care management team and has been advised to call with any health related questions or concerns.

## 2019-11-23 NOTE — Patient Instructions (Signed)
Visit Information  Goals Addressed            This Visit's Progress     Patient Stated   . PharmD "I want to control my sugars better" (pt-stated)       Current Barriers:  . Diabetes: uncontrolled; complicated by chronic medical conditions including HLD, OSA, PTSD, most recent A1c in 8-9% per his report at [facility] recently o Currently struggling w/ PTSD, recent separation from spouse, though looking forward to pending retirement in June. Notes he feels he is "straddling two worlds" with [place] providers and [place] providers  . Current antihyperglycemic regimen: metformin 1000 mg BID, glipizide 10 mg (patient unsure of dose), Lantus 50 units daily  . Denies hypoglycemic episodes  . Current meal patterns: o Notes he has been actively trying to focus on serving sizes, physical activity . Current exercise: none besides walking his dog; limited by back pain  . Current blood glucose readings: Reports readings now in 160-180s . Cardiovascular risk reduction: o Current hypertensive regimen: prazosin 1 mg QPM (though for PTSD/nightmares) o Current hyperlipidemia regimen: atorvastatin 80 mg daily, niacin 500 mg daily   Pharmacist Clinical Goal(s):  Marland Kitchen Over the next 90 days, patient will work with PharmD and primary care provider to address optimized medication management  Interventions: . Comprehensive medication review performed, medication list updated in electronic medical record . Discussed goal A1c, goal fasting glucose and goal 2 hour post prandial glucose. Patient highly skeptical that he will get to goal A1c . Extensive dietary education provided. Provided w/ Planning Healthy Meals handout . Patient notes he is not in a place to set a weight loss goal, he is more focused on maintaining right now, due to personal and work related stressors. . Recommend addition of GLP1. Collaborated w/ Marnee Guarneri, NP. Ozempic prescribed. Counseled patient on downloading copay card to reduce cost to no more  than $25/month. Patient verbalized understanding.   Patient Self Care Activities:  . Patient will check blood glucose daily-BID , document, and provide at future appointments . Patient will take medications as prescribed . Patient will report any questions or concerns to provider   Initial goal documentation        The patient verbalized understanding of instructions provided today and declined a print copy of patient instruction materials.    Plan: - CCM team will outreach patient for continued medication management support in ~4-6 weeks  Catie [name], PharmD, [place] [phone]

## 2019-11-23 NOTE — Patient Instructions (Addendum)
Lisinopril (ACE) or Losartan (ARB) --- offer kidney protection in diabetes  Carbohydrate Counting for Diabetes Mellitus, Adult  Carbohydrate counting is a method of keeping track of how many carbohydrates you eat. Eating carbohydrates naturally increases the amount of sugar (glucose) in the blood. Counting how many carbohydrates you eat helps keep your blood glucose within normal limits, which helps you manage your diabetes (diabetes mellitus). It is important to know how many carbohydrates you can safely have in each meal. This is different for every person. A diet and nutrition specialist (registered dietitian) can help you make a meal plan and calculate how many carbohydrates you should have at each meal and snack. Carbohydrates are found in the following foods:  Grains, such as breads and cereals.  Dried beans and soy products.  Starchy vegetables, such as potatoes, peas, and corn.  Fruit and fruit juices.  Milk and yogurt.  Sweets and snack foods, such as cake, cookies, candy, chips, and soft drinks. How do I count carbohydrates? There are two ways to count carbohydrates in food. You can use either of the methods or a combination of both. Reading "Nutrition Facts" on packaged food The "Nutrition Facts" list is included on the labels of almost all packaged foods and beverages in the [place] It includes:  The serving size.  Information about nutrients in each serving, including the grams (g) of carbohydrate per serving. To use the "Nutrition Facts":  Decide how many servings you will have.  Multiply the number of servings by the number of carbohydrates per serving.  The resulting number is the total amount of carbohydrates that you will be having. Learning standard serving sizes of other foods When you eat carbohydrate foods that are not packaged or do not include "Nutrition Facts" on the label, you need to measure the servings in order to count the amount of  carbohydrates:  Measure the foods that you will eat with a food scale or measuring cup, if needed.  Decide how many standard-size servings you will eat.  Multiply the number of servings by 15. Most carbohydrate-rich foods have about 15 g of carbohydrates per serving. ? For example, if you eat 8 oz (170 g) of strawberries, you will have eaten 2 servings and 30 g of carbohydrates (2 servings x 15 g = 30 g).  For foods that have more than one food mixed, such as soups and casseroles, you must count the carbohydrates in each food that is included. The following list contains standard serving sizes of common carbohydrate-rich foods. Each of these servings has about 15 g of carbohydrates:   hamburger bun or  English muffin.   oz (15 mL) syrup.   oz (14 g) jelly.  1 slice of bread.  1 six-inch tortilla.  3 oz (85 g) cooked rice or pasta.  4 oz (113 g) cooked dried beans.  4 oz (113 g) starchy vegetable, such as peas, corn, or potatoes.  4 oz (113 g) hot cereal.  4 oz (113 g) mashed potatoes or  of a large baked potato.  4 oz (113 g) canned or frozen fruit.  4 oz (120 mL) fruit juice.  4-6 crackers.  6 chicken nuggets.  6 oz (170 g) unsweetened dry cereal.  6 oz (170 g) plain fat-free yogurt or yogurt sweetened with artificial sweeteners.  8 oz (240 mL) milk.  8 oz (170 g) fresh fruit or one small piece of fruit.  24 oz (680 g) popped popcorn. Example of carbohydrate counting Sample meal  3 oz (85 g) chicken breast.  6 oz (170 g) brown rice.  4 oz (113 g) corn.  8 oz (240 mL) milk.  8 oz (170 g) strawberries with sugar-free whipped topping. Carbohydrate calculation 1. Identify the foods that contain carbohydrates: ? Rice. ? Corn. ? Milk. ? Strawberries. 2. Calculate how many servings you have of each food: ? 2 servings rice. ? 1 serving corn. ? 1 serving milk. ? 1 serving strawberries. 3. Multiply each number of servings by 15 g: ? 2 servings rice  x 15 g = 30 g. ? 1 serving corn x 15 g = 15 g. ? 1 serving milk x 15 g = 15 g. ? 1 serving strawberries x 15 g = 15 g. 4. Add together all of the amounts to find the total grams of carbohydrates eaten: ? 30 g + 15 g + 15 g + 15 g = 75 g of carbohydrates total. Summary  Carbohydrate counting is a method of keeping track of how many carbohydrates you eat.  Eating carbohydrates naturally increases the amount of sugar (glucose) in the blood.  Counting how many carbohydrates you eat helps keep your blood glucose within normal limits, which helps you manage your diabetes.  A diet and nutrition specialist (registered dietitian) can help you make a meal plan and calculate how many carbohydrates you should have at each meal and snack. This information is not intended to replace advice given to you by your health care provider. Make sure you discuss any questions you have with your health care provider. Document Released: 11/29/2005 Document Revised: 06/23/2017 Document Reviewed: 05/12/2016 Elsevier Patient Education  2020 [employer].

## 2019-11-23 NOTE — Chronic Care Management (AMB) (Signed)
Chronic Care Management   Note  11/23/2019 Name: Luis Hatfield. MRN: [id number] DOB: Dec 30, 1965   Subjective:  Luis Hatfield. is a 53 y.o. year old male who is a primary care patient of Cannady, Barbaraann Faster, NP. The CCM team was consulted for assistance with chronic disease management and care coordination needs.    Met with patient face to face today prior to appointment w/ PCP   [name] was given information about Chronic Care Management services today including:  1. CCM service includes personalized support from designated clinical staff supervised by his physician, including individualized plan of care and coordination with other care providers 2. 24/7 contact phone numbers for assistance for urgent and routine care needs. 3. Service will only be billed when office clinical staff spend 20 minutes or more in a month to coordinate care. 4. Only one practitioner may furnish and bill the service in a calendar month. 5. The patient may stop CCM services at any time (effective at the end of the month) by phone call to the office staff. 6. The patient will be responsible for cost sharing (co-pay) of up to 20% of the service fee (after annual deductible is met).  Patient agreed to services and verbal consent obtained.   Review of patient status, including review of consultants reports, laboratory and other test data, was performed as part of comprehensive evaluation and provision of chronic care management services.   Objective:  Lab Results  Component Value Date   CREATININE 0.79 05/17/2019   CREATININE 0.91 03/20/2019   CREATININE 1.05 09/19/2017    Lab Results  Component Value Date   HGBA1C 10.7 (H) 03/20/2019       Component Value Date/Time   CHOL 159 03/20/2019 0817   TRIG 575 (HH) 03/20/2019 0817   HDL 29 (L) 03/20/2019 0817   LDLCALC Comment 03/20/2019 0817    Clinical ASCVD: No  The 10-year ASCVD risk score Mikey Bussing DC Jr., et al., 2013) is:  14.6%   Values used to calculate the score:     Age: 78 years     Sex: Male     Is Non-Hispanic African American: No     Diabetic: Yes     Tobacco smoker: No     Systolic Blood Pressure: 413 mmHg     Is BP treated: No     HDL Cholesterol: 29 mg/dL     Total Cholesterol: 159 mg/dL    BP Readings from Last 3 Encounters:  11/23/19 (!) 145/74  10/26/19 135/76  06/14/19 119/76    No Known Allergies  Medications Reviewed Today    Reviewed by Venita Lick, NP (Nurse Practitioner) on 11/23/19 at [facility] Status: <None>  Medication Order Taking? Sig Documenting Provider Last Dose Status Informant  albuterol (VENTOLIN HFA) 108 (90 Base) MCG/ACT inhaler [id number] Yes Inhale into the lungs as needed.  [provider] Taking Active   atorvastatin (LIPITOR) 80 MG tablet [id number] Yes Take 80 mg by mouth daily.  [provider] Taking Active   cetirizine (ZYRTEC) 10 MG tablet [id number] Yes  [provider] Taking Active   cholestyramine (QUESTRAN) 4 g packet [id number] Yes Take 1 packet (4 g total) by mouth 3 (three) times daily with meals. [name], MD Taking Active   glipiZIDE (GLUCOTROL) 10 MG tablet [id number] Yes glipizide 10 mg tablet [provider] Taking Active   hydrocortisone ointment 0.5 % [id number] Yes Apply 1 application topically 2 (two) times daily.  Flinchum, Kelby Aline, FNP Taking Active   ibuprofen (ADVIL,MOTRIN) 200 MG tablet [id number] Yes Take by mouth. [provider] Taking Active   LANTUS 100 UNIT/ML injection [id number] Yes  [provider] Taking Active            Med Note [name], Bryer Cozzolino E   Fri Nov 23, 2019  3:07 PM) Taking 50 units QPM  metFORMIN (GLUCOPHAGE) 1000 MG tablet [id number] Yes Take 1,000 mg by mouth 2 (two) times daily.  [provider] Taking Active   niacin 100 MG tablet [id number] Yes  [provider] Taking Active   omeprazole (PRILOSEC) 20 MG capsule [id number] Yes   [provider] Taking Active   prazosin (MINIPRESS) 1 MG capsule [id number] Yes Take 1 mg by mouth.  [provider] Taking Active   selenium sulfide (SELSUN) 2.5 % shampoo [id number] Yes as needed.  [provider] Taking Active   sertraline (ZOLOFT) 100 MG tablet [id number] Yes Take 100 mg by mouth daily. [provider] Taking Active            Assessment:   Goals Addressed            This Visit's Progress     Patient Stated   . PharmD "I want to control my sugars better" (pt-stated)       Current Barriers:  . Diabetes: uncontrolled; complicated by chronic medical conditions including HLD, OSA, PTSD, most recent A1c in 8-9% per his report at [facility] recently o Currently struggling w/ PTSD, recent separation from spouse, though looking forward to pending retirement in June. Notes he feels he is "straddling two worlds" with [place] providers and [place] providers  . Current antihyperglycemic regimen: metformin 1000 mg BID, glipizide 10 mg (patient unsure of dose), Lantus 50 units daily  . Denies hypoglycemic episodes  . Current meal patterns: o Notes he has been actively trying to focus on serving sizes, physical activity . Current exercise: none besides walking his dog; limited by back pain  . Current blood glucose readings: Reports readings now in 160-180s . Cardiovascular risk reduction: o Current hypertensive regimen: prazosin 1 mg QPM (though for PTSD/nightmares) o Current hyperlipidemia regimen: atorvastatin 80 mg daily, niacin 500 mg daily   Pharmacist Clinical Goal(s):  Marland Kitchen Over the next 90 days, patient will work with PharmD and primary care provider to address optimized medication management  Interventions: . Comprehensive medication review performed, medication list updated in electronic medical record . Discussed goal A1c, goal fasting glucose and goal 2 hour post prandial glucose. Patient highly skeptical that he will get to goal  A1c . Extensive dietary education provided. Provided w/ Planning Healthy Meals handout . Patient notes he is not in a place to set a weight loss goal, he is more focused on maintaining right now, due to personal and work related stressors. . Recommend addition of GLP1. Collaborated w/ Marnee Guarneri, NP. Ozempic prescribed. Counseled patient on downloading copay card to reduce cost to no more than $25/month. Patient verbalized understanding.   Patient Self Care Activities:  . Patient will check blood glucose daily-BID , document, and provide at future appointments . Patient will take medications as prescribed . Patient will report any questions or concerns to provider   Initial goal documentation        Plan: - CCM team will outreach patient for continued medication management support in ~4-6 weeks  Catie [name], PharmD, [place] [phone]

## 2019-11-23 NOTE — Chronic Care Management (AMB) (Signed)
Care Management   Initial Visit Note  11/23/2019 Name: Luis Hatfield. MRN: [id number] DOB: 02-27-1966  Subjective:   Objective:  Assessment: Luis Hatfield. is a 53 y.o. year old male who sees [place], Henrine Screws T, NP for primary care. The care management team was consulted for assistance with care management and care coordination needs related to Disease Management Educational Needs Medication Management and Education.   Review of patient status, including review of consultants reports, relevant laboratory and other test results, and collaboration with appropriate care team members and the patient's provider was performed as part of comprehensive patient evaluation and provision of care management services.    SDOH (Social Determinants of Health) screening performed today: Physical Activity. See Care Plan for related entries.    Outpatient Encounter Medications as of 11/23/2019  Medication Sig Note  . albuterol (VENTOLIN HFA) 108 (90 Base) MCG/ACT inhaler Inhale into the lungs as needed.    Marland Kitchen atorvastatin (LIPITOR) 80 MG tablet Take 80 mg by mouth daily.    . cetirizine (ZYRTEC) 10 MG tablet    . cholestyramine (QUESTRAN) 4 g packet Take 1 packet (4 g total) by mouth 3 (three) times daily with meals.   Marland Kitchen glipiZIDE (GLUCOTROL) 10 MG tablet glipizide 10 mg tablet   . hydrocortisone ointment 0.5 % Apply 1 application topically 2 (two) times daily.   Marland Kitchen ibuprofen (ADVIL,MOTRIN) 200 MG tablet Take by mouth.   Marland Kitchen LANTUS 100 UNIT/ML injection  11/23/2019: Taking 50 units QPM  . metFORMIN (GLUCOPHAGE) 1000 MG tablet Take 1,000 mg by mouth 2 (two) times daily.    . niacin 100 MG tablet    . omeprazole (PRILOSEC) 20 MG capsule    . prazosin (MINIPRESS) 1 MG capsule Take 1 mg by mouth.    . selenium sulfide (SELSUN) 2.5 % shampoo as needed.    . sertraline (ZOLOFT) 100 MG tablet Take 100 mg by mouth daily.    No facility-administered encounter medications on file as of  11/23/2019.    Goals Addressed            This Visit's Progress   . RN- I want to get healthy to stay around a long time for my daughter (pt-stated)       Current Barriers:  . Chronic Disease Management support, education, and care coordination needs related to HLD, Hypertriglyceridemia, COPD, DMII, Anxiety, Depression, and Back Pain   Clinical Goal(s) related to HLD, Hypertriglyceridemia, COPD, DMII, Anxiety, Depression, and Back Pain  :  Over the next 90 days, patient will:  . Work with the care management team to address educational, disease management, and care coordination needs  . Begin or continue self health monitoring activities as directed today Measure and record cbg (blood glucose) one  times daily . Call provider office for new or worsened signs and symptoms Blood glucose findings outside established parameters . Call care management team with questions or concerns . Verbalize basic understanding of patient centered plan of care established today  Interventions related to HLD, Hypertriglyceridemia, COPD, DMII, Anxiety, and Depression:  . Evaluation of current treatment plans and patient's adherence to plan as established by provider . Assessed patient understanding of disease states . Assessed patient's education and care coordination needs . Provided disease specific education to patient  . Collaborated with appropriate clinical care team members regarding patient needs . Met with patient in person at PCP office.  . Discussed goals and patient is trying to reduce portions and stay healthy until  retires in June.  . Plate method teaching done. Discussed the difficulty of cooking for 1 since he has been separated from spouse   . Encouraged patient to exercise, he currently is walking his dog and taking walks with his daughter. He is planning to obtain a bike.   Patient Self Care Activities related to HLD, Hypertriglyceridemia, COPD, DMII, Anxiety, Depression, and Back Pain   :  . Patient is unable to independently self-manage chronic health conditions  Initial goal documentation         Follow up plan:  The care management team will reach out to the patient again over the next 60 days.  The patient has been provided with contact information for the care management team and has been advised to call with any health related questions or concerns.   Mr. Aloia was given information about Care Management services today including:  1. Care Management services include personalized support from designated clinical staff supervised by a physician, including individualized plan of care and coordination with other care providers 2. 24/7 contact phone numbers for assistance for urgent and routine care needs. 3. The patient may stop Care Management services at any time (effective at the end of the month) by phone call to the office staff.  Patient agreed to services and verbal consent obtained.  Merlene Morse Francis Yardley RN, BSN Nurse Case Editor, commissioning [facility] Care Management  [phone]) Business Mobile

## 2019-11-23 NOTE — Assessment & Plan Note (Signed)
Recommend continued focus on health diet choices and regular physical activity (30 minutes 5 days a week). 

## 2019-11-23 NOTE — Progress Notes (Signed)
BP (!) 145/74   Pulse 91   Temp 99.5 F (37.5 C) (Oral)   SpO2 97%    Subjective:    Patient ID: Luis Craze., male    DOB: 1966-03-20, 53 y.o.   MRN: [id number]  HPI: Luis Scharpf. is a 53 y.o. male  Chief Complaint  Patient presents with  . Diabetes  . Hyperlipidemia  . Hypertension   DIABETES Had A1C 10.7% in April, he reports this is on way down was 8 something during recent [place] visit.  He is going to [place] labs to PCP.  Continues on Lantus 45 units at night, Metformin, and Glipizide.  He is followed by PCP at [facility] for this, PA Hennie Duos.  Returns to [place] in three months for diabetes check.  Sat with CCM team and we have discussed addition of GLP, which he is agreeable to. Hypoglycemic episodes:no Polydipsia/polyuria: no Visual disturbance: no Chest pain: no Paresthesias: no Glucose Monitoring: yes             Accucheck frequency: rarely             Fasting glucose: 180's             Post prandial:             Evening:             Before meals: Taking Insulin?: yes             Long acting insulin: Lantus 50 units             Short acting insulin: Blood Pressure Monitoring: not checking Retinal Examination: Up to Date, at [place] Exam: Up to Date  Pneumovax: Up to Date Influenza: Up to Date Aspirin: yes   HYPERLIPIDEMIA Has history of trigs in 575 and 946 on review, is on Niacin and Simvastatin. Followed by PCP at the [facility] in [place] and reports recent labs showed bad cholesterol had come down and good cholesterol is improved. Hyperlipidemia status: good compliance Satisfied with current treatment?  yes Side effects:  no Medication compliance: good compliance Past cholesterol meds: he is unsure Supplements: none Aspirin:  yes The 10-year ASCVD risk score Mikey Bussing DC Jr., et al., 2013) is: 13%   Values used to calculate the score:     Age: 63 years     Sex: Male     Is Non-Hispanic African American: No     Diabetic: Yes     Tobacco smoker:  No     Systolic Blood Pressure: [id number] mmHg     Is BP treated: No     HDL Cholesterol: 29 mg/dL     Total Cholesterol: 159 mg/dL Chest pain:  no Coronary artery disease:  no Family history CAD:  no Family history early CAD:  no   HYPERTENSION Not currently on any medications.  Discussed with him and wrote down Lisinopril and Losartan to discuss with his [place] provider, which would also offer kidney protection in diabetes.  Educated him on this. Hypertension status: stable  Satisfied with current treatment? yes Aspirin: no Recurrent headaches: no Visual changes: no Palpitations: no Dyspnea: no Chest pain: no Lower extremity edema: no Dizzy/lightheaded: no  Relevant past medical, surgical, family and social history reviewed and updated as indicated. Interim medical history since our last visit reviewed. Allergies and medications reviewed and updated.  Review of Systems  Constitutional: Negative for activity change, diaphoresis, fatigue and fever.  Respiratory: Negative for cough, chest tightness, shortness  of breath and wheezing.   Cardiovascular: Negative for chest pain, palpitations and leg swelling.  Gastrointestinal: Negative for abdominal distention, abdominal pain, constipation, diarrhea, nausea and vomiting.  Endocrine: Negative for cold intolerance, heat intolerance, polydipsia, polyphagia and polyuria.  Neurological: Negative for dizziness, syncope, weakness, light-headedness, numbness and headaches.  Psychiatric/Behavioral: Negative.     Per HPI unless specifically indicated above     Objective:    BP (!) 145/74   Pulse 91   Temp 99.5 F (37.5 C) (Oral)   SpO2 97%   Wt Readings from Last 3 Encounters:  10/26/19 260 lb (117.9 kg)  06/14/19 262 lb 8 oz (119.1 kg)  03/14/19 258 lb (117 kg)    Physical Exam Vitals and nursing note reviewed.  Constitutional:      General: He is awake. He is not in acute distress.    Appearance: He is well-developed. He is obese.  He is not ill-appearing.  HENT:     Head: Normocephalic and atraumatic.     Right Ear: Hearing normal. No drainage.     Left Ear: Hearing normal. No drainage.  Eyes:     General: Lids are normal.        Right eye: No discharge.        Left eye: No discharge.     Conjunctiva/sclera: Conjunctivae normal.     Pupils: Pupils are equal, round, and reactive to light.  Neck:     Thyroid: No thyromegaly.     Vascular: No carotid bruit.  Cardiovascular:     Rate and Rhythm: Normal rate and regular rhythm.     Heart sounds: Normal heart sounds, S1 normal and S2 normal. No murmur. No gallop.   Pulmonary:     Effort: Pulmonary effort is normal. No accessory muscle usage or respiratory distress.     Breath sounds: Normal breath sounds.  Abdominal:     General: Bowel sounds are normal.     Palpations: Abdomen is soft.  Musculoskeletal:        General: Normal range of motion.     Cervical back: Normal range of motion and neck supple.     Right lower leg: No edema.     Left lower leg: No edema.  Lymphadenopathy:     Cervical: No cervical adenopathy.  Skin:    General: Skin is warm and dry.  Neurological:     Mental Status: He is alert and oriented to person, place, and time.  Psychiatric:        Attention and Perception: Attention normal.        Mood and Affect: Mood normal.        Speech: Speech normal.        Behavior: Behavior normal. Behavior is cooperative.        Thought Content: Thought content normal.        Judgment: Judgment normal.    Results for orders placed or performed in visit on 05/17/19  Novel Coronavirus, NAA ([facility])  Result Value Ref Range   SARS-CoV-2, NAA Not Detected Not Detected      Assessment & Plan:   Problem List Items Addressed This Visit      Endocrine   Poorly controlled type 2 diabetes mellitus ([place]) - Primary    Chronic, ongoing poorly controlled.  Continued current medication regimen and [facility] with [facility] and [facility] team.  Will add on  Ozempic, sent this to local pharmacy.  He agrees to check BS daily in morning, which he has  not consistently been doing, due to concern for hypoglycemia.  Discussed possibility of insulin reduction in future if benefit from [place].  Return in 4 weeks for follow-up.      Relevant Medications   Semaglutide,0.25 or 0.5MG /DOS, (OZEMPIC, 0.25 OR 0.5 MG/DOSE,) 2 MG/1.5ML SOPN   Hyperlipidemia associated with type 2 diabetes mellitus (HCC)    Chronic, ongoing.  Continue Simvastatin and Niacin + collaboration with [facility], to send his recent labs via [place].      Relevant Medications   Semaglutide,0.25 or 0.5MG /DOS, (OZEMPIC, 0.25 OR 0.5 MG/DOSE,) 2 MG/1.5ML SOPN     Other   Morbid obesity due to excess calories (HCC)    With BMI 36.26 and T2DM + HTN.  Recommend continued focus on health diet choices and regular physical activity (30 minutes 5 days a week).       Relevant Medications   Semaglutide,0.25 or 0.5MG /DOS, (OZEMPIC, 0.25 OR 0.5 MG/DOSE,) 2 MG/1.5ML SOPN   BMI 36.0-36.9,adult    Recommend continued focus on health diet choices and regular physical activity (30 minutes 5 days a week).       Elevated BP without diagnosis of hypertension    Noted on exam today, due to diabetes did recommend he speak to [facility] about adding on a low dose of Lisinopril or Losartan for kidney protection and BP control.  He can obtain this at [facility] for free.  Monitor BP at home daily and document, DASH diet recommended.  Continue to monitor and adjust regimen as needed.          Follow up plan: Return in about 4 weeks (around 12/21/2019) for T2DM with med changes.

## 2019-11-23 NOTE — Assessment & Plan Note (Signed)
With BMI 36.26 and T2DM + HTN.  Recommend continued focus on health diet choices and regular physical activity (30 minutes 5 days a week).

## 2019-11-23 NOTE — Assessment & Plan Note (Signed)
Noted on exam today, due to diabetes did recommend he speak to his [place] PCP about adding on a low dose of Lisinopril or Losartan for kidney protection and BP control.  He can obtain this at [facility] for free.  Monitor BP at home daily and document, DASH diet recommended.  Continue to monitor and adjust regimen as needed.

## 2019-11-23 NOTE — Assessment & Plan Note (Signed)
Chronic, ongoing poorly controlled.  Continued current medication regimen and collaboration with [facility] and [facility] team.  Will add on Ozempic, sent this to local pharmacy.  He agrees to check BS daily in morning, which he has not consistently been doing, due to concern for hypoglycemia.  Discussed possibility of insulin reduction in future if benefit from [place].  Return in 4 weeks for follow-up.

## 2019-11-30 ENCOUNTER — Ambulatory Visit: Payer: Self-pay | Admitting: Pharmacist

## 2019-11-30 ENCOUNTER — Telehealth: Payer: Self-pay | Admitting: Nurse Practitioner

## 2019-11-30 DIAGNOSIS — Z6836 Body mass index (BMI) 36.0-36.9, adult: Secondary | ICD-10-CM

## 2019-11-30 DIAGNOSIS — E1165 Type 2 diabetes mellitus with hyperglycemia: Secondary | ICD-10-CM

## 2019-11-30 NOTE — Chronic Care Management (AMB) (Signed)
Chronic Care Management   Follow Up Note   11/30/2019 Name: Luis Hatfield. MRN: [id number] DOB: [birth year]  Referred by: Venita Lick, NP Reason for referral : Chronic Care Management (Medication Management)   Luis Hatfield. is a 53 y.o. year old male who is a primary care patient of Cannady, Barbaraann Faster, NP. The CCM team was consulted for assistance with chronic disease management and care coordination needs.    Receive office message that there were medication access needs.   Review of patient status, including review of consultants reports, relevant laboratory and other test results, and collaboration with appropriate care team members and the patient's provider was performed as part of comprehensive patient evaluation and provision of chronic care management services.    SDOH (Social Determinants of Health) screening performed today: Financial Strain . See Care Plan for related entries.   Outpatient Encounter Medications as of 11/30/2019  Medication Sig Note  . albuterol (VENTOLIN HFA) 108 (90 Base) MCG/ACT inhaler Inhale into the lungs as needed.    Marland Kitchen atorvastatin (LIPITOR) 80 MG tablet Take 80 mg by mouth daily.    . cetirizine (ZYRTEC) 10 MG tablet    . cholestyramine (QUESTRAN) 4 g packet Take 1 packet (4 g total) by mouth 3 (three) times daily with meals.   Marland Kitchen glipiZIDE (GLUCOTROL) 10 MG tablet glipizide 10 mg tablet   . hydrocortisone ointment 0.5 % Apply 1 application topically 2 (two) times daily.   Marland Kitchen ibuprofen (ADVIL,MOTRIN) 200 MG tablet Take by mouth.   Marland Kitchen LANTUS 100 UNIT/ML injection  11/23/2019: Taking 50 units QPM  . metFORMIN (GLUCOPHAGE) 1000 MG tablet Take 1,000 mg by mouth 2 (two) times daily.    . niacin 100 MG tablet    . omeprazole (PRILOSEC) 20 MG capsule    . prazosin (MINIPRESS) 1 MG capsule Take 1 mg by mouth.    . selenium sulfide (SELSUN) 2.5 % shampoo as needed.    . Semaglutide,0.25 or 0.5MG /DOS, (OZEMPIC, 0.25 OR 0.5 MG/DOSE,) 2  MG/1.5ML SOPN Inject 0.25 mg into the skin once a week. Start with 0.25MG  once a week x 4 weeks, then increase to 0.5MG  weekly.   . sertraline (ZOLOFT) 100 MG tablet Take 100 mg by mouth daily.    No facility-administered encounter medications on file as of 11/30/2019.     Goals Addressed            This Visit's Progress     Patient Stated   . PharmD "I want to control my sugars better" (pt-stated)       Current Barriers:  . Diabetes: uncontrolled; complicated by chronic medical conditions including HLD, OSA, PTSD, most recent A1c in 8-9% per his report at [facility] recently o Currently struggling w/ PTSD, recent separation from spouse, though looking forward to pending retirement in June. Notes he feels he is "straddling two worlds" with [place] providers and [place] providers  . Current antihyperglycemic regimen: metformin 1000 mg BID, glipizide 10 mg (patient unsure of dose), Lantus 50 units daily  o Collaboratively decided to start Ozempic therapy under his commercial [employer], as he notes his [place] providers have said he does not meet VA criteria for GLP1 therapy  . Denies hypoglycemic episodes  . Current meal patterns: o Notes he has been actively trying to focus on serving sizes, physical activity . Current exercise: none besides walking his dog; limited by back pain  . Current blood glucose readings: Reports readings now in 160-180s . Cardiovascular  risk reduction: o Current hypertensive regimen: prazosin 1 mg QPM (though for PTSD/nightmares) o Current hyperlipidemia regimen: atorvastatin 80 mg daily, niacin 500 mg daily   Pharmacist Clinical Goal(s):  Marland Kitchen Over the next 90 days, patient will work with PharmD and primary care provider to address optimized medication management  Interventions: . [facility]ed [facility]. They note denial d/t "needing additional therapy" . Contacted [employer]. They note that a PA is required. Started the PA process over the phone. Provided dosing  instructions, provider contact information, as well as answered clinical questions. Review [id number]. They will process PA and fax determination to the office  . Contacted patient, informed of the above. He verbalized understanding.  Patient Self Care Activities:  . Patient will check blood glucose daily-BID , document, and provide at future appointments . Patient will take medications as prescribed . Patient will report any questions or concerns to provider   Please see past updates related to this goal by clicking on the "Past Updates" button in the selected goal          Plan: - Will collaborate w/ office staff on any further needs w/ PA as above  Catie [name], PharmD, [place] [phone]

## 2019-11-30 NOTE — Telephone Encounter (Signed)
Pt in office stating, he still has not gotten the semaglutide medication, [facility] stated that they are having issues with the insurance in regards to the medication.Please advise.

## 2019-11-30 NOTE — Patient Instructions (Signed)
Visit Information  Goals Addressed            This Visit's Progress     Patient Stated   . PharmD "I want to control my sugars better" (pt-stated)       Current Barriers:  . Diabetes: uncontrolled; complicated by chronic medical conditions including HLD, OSA, PTSD, most recent A1c in 8-9% per his report at [facility] recently o Currently struggling w/ PTSD, recent separation from spouse, though looking forward to pending retirement in June. Notes he feels he is "straddling two worlds" with [place] providers and [place] providers  . Current antihyperglycemic regimen: metformin 1000 mg BID, glipizide 10 mg (patient unsure of dose), Lantus 50 units daily  o Collaboratively decided to start Ozempic therapy under his commercial [employer], as he notes his [place] providers have said he does not meet VA criteria for GLP1 therapy  . Denies hypoglycemic episodes  . Current meal patterns: o Notes he has been actively trying to focus on serving sizes, physical activity . Current exercise: none besides walking his dog; limited by back pain  . Current blood glucose readings: Reports readings now in 160-180s . Cardiovascular risk reduction: o Current hypertensive regimen: prazosin 1 mg QPM (though for PTSD/nightmares) o Current hyperlipidemia regimen: atorvastatin 80 mg daily, niacin 500 mg daily   Pharmacist Clinical Goal(s):  Marland Kitchen Over the next 90 days, patient will work with PharmD and primary care provider to address optimized medication management  Interventions: . [facility]. They note denial d/t "needing additional therapy" . Contacted [employer]. They note that a PA is required. Started the PA process over the phone. Provided dosing instructions, provider contact information, as well as answered clinical questions. Review [id number]. They will process PA and fax determination to the office  . Contacted patient, informed of the above. He verbalized understanding.  Patient Self Care  Activities:  . Patient will check blood glucose daily-BID , document, and provide at future appointments . Patient will take medications as prescribed . Patient will report any questions or concerns to provider   Please see past updates related to this goal by clicking on the "Past Updates" button in the selected goal         The patient verbalized understanding of instructions provided today and declined a print copy of patient instruction materials.    Plan: - Will collaborate w/ office staff on any further needs w/ PA as above  Catie [name], PharmD, [place] [phone]

## 2019-11-30 NOTE — Telephone Encounter (Signed)
See CCM note. PA submitted to [facility] [id number]. Will await decision

## 2019-11-30 NOTE — Telephone Encounter (Signed)
Do you mind assisting me with this.  Appears insurance is giving difficulty about Ozempic.  Can we look into this and assist patient?  Thank you.

## 2019-12-03 NOTE — Telephone Encounter (Signed)
Wonderful, thank you

## 2019-12-03 NOTE — Telephone Encounter (Signed)
If you get a chance today, could you call patient and let him know?  Please remind him of Ozempic copay card, available on Ozempic website, if copay is too expensive. THANKS!

## 2019-12-03 NOTE — Telephone Encounter (Signed)
Received fax from [employer]. Medication was approved through 12/02/20.

## 2019-12-03 NOTE — Telephone Encounter (Signed)
Patient notified

## 2019-12-26 ENCOUNTER — Ambulatory Visit: Payer: Self-pay

## 2019-12-27 NOTE — Chronic Care Management (AMB) (Signed)
  Care Management   Follow Up Note   12/27/2019 Name: Luis Hatfield. MRN: [id number] DOB: [birth year]  Referred by: Venita Lick, NP Reason for referral : Care Coordination   Luis Hatfield. is a 54 y.o. year old male who is a primary care patient of Cannady, Barbaraann Faster, NP. The care management team was consulted for assistance with care management and care coordination needs.    Review of patient status, including review of consultants reports, relevant laboratory and other test results, and collaboration with appropriate care team members and the patient's provider was performed as part of comprehensive patient evaluation and provision of chronic care management services.    LCSW completed CCM outreach attempt today but was unable to reach patient successfully. A HIPPA compliant voice message was left encouraging patient to return call once available. LCSW rescheduled CCM SW appointment as well.  A HIPPA compliant phone message was left for the patient providing contact information and requesting a return call.   Eula Fried, BSW, MSW, [facility] Care Management [email or web address] Phone: [phone]

## 2019-12-28 ENCOUNTER — Other Ambulatory Visit: Payer: Self-pay

## 2019-12-28 ENCOUNTER — Encounter: Payer: Self-pay | Admitting: Nurse Practitioner

## 2019-12-28 ENCOUNTER — Ambulatory Visit (INDEPENDENT_AMBULATORY_CARE_PROVIDER_SITE_OTHER): Payer: Managed Care, Other (non HMO) | Admitting: Nurse Practitioner

## 2019-12-28 DIAGNOSIS — E1165 Type 2 diabetes mellitus with hyperglycemia: Secondary | ICD-10-CM

## 2019-12-28 NOTE — Progress Notes (Signed)
BP 114/73 (BP Location: Right Arm, Patient Position: Sitting, Cuff Size: Normal)   Pulse 91   Temp 99.3 F (37.4 C) (Oral)   SpO2 97%    Subjective:    Patient ID: Luis Hatfield., male    DOB: June 17, 1966, 54 y.o.   MRN: [id number]  HPI: Luis Hatfield. is a 54 y.o. male  Chief Complaint  Patient presents with  . Follow-up    4 wk T2DM   DIABETES Took initial dose Ozempic on Saturday, had headache and nausea for 4 days afterwards, reports two days were fairly intolerable and then gradually improved.  Was also constipated for 3-4 days.  Last A1C in November at [place] was 9.6%, this is scanned into chart.  At last visit fasting blood sugars were in 180's, some change noted with initial Ozempic. Hypoglycemic episodes:no Polydipsia/polyuria: no Visual disturbance: no Chest pain: no Paresthesias: no Glucose Monitoring: yes  Accucheck frequency: every other day  Fasting glucose: 130 to 145, with one 159  Post prandial:  Evening:  Before meals: Taking Insulin?:yes Long acting insulin: Lantus 50 units Short acting insulin: Blood Pressure Monitoring:not checking Retinal Examination:Up to Date, at [place] to Date Pneumovax:Up to Date Influenza:Up to Date Aspirin:yes  Relevant past medical, surgical, family and social history reviewed and updated as indicated. Interim medical history since our last visit reviewed. Allergies and medications reviewed and updated.  Review of Systems  Constitutional: Negative for activity change, diaphoresis, fatigue and fever.  Respiratory: Negative for cough, chest tightness, shortness of breath and wheezing.   Cardiovascular: Negative for chest pain, palpitations and leg swelling.  Endocrine: Negative for polydipsia, polyphagia and polyuria.  Psychiatric/Behavioral: Negative.     Per HPI unless specifically indicated above     Objective:    BP 114/73 (BP Location: Right Arm, Patient  Position: Sitting, Cuff Size: Normal)   Pulse 91   Temp 99.3 F (37.4 C) (Oral)   SpO2 97%   Wt Readings from Last 3 Encounters:  10/26/19 260 lb (117.9 kg)  06/14/19 262 lb 8 oz (119.1 kg)  03/14/19 258 lb (117 kg)    Physical Exam Vitals and nursing note reviewed.  Constitutional:      General: He is awake. He is not in acute distress.    Appearance: He is well-developed. He is obese. He is not ill-appearing.  HENT:     Head: Normocephalic and atraumatic.     Right Ear: Hearing normal. No drainage.     Left Ear: Hearing normal. No drainage.  Eyes:     General: Lids are normal.        Right eye: No discharge.        Left eye: No discharge.     Conjunctiva/sclera: Conjunctivae normal.     Pupils: Pupils are equal, round, and reactive to light.  Neck:     Thyroid: No thyromegaly.     Vascular: No carotid bruit.  Cardiovascular:     Rate and Rhythm: Normal rate and regular rhythm.     Heart sounds: Normal heart sounds, S1 normal and S2 normal. No murmur. No gallop.   Pulmonary:     Effort: Pulmonary effort is normal. No accessory muscle usage or respiratory distress.     Breath sounds: Normal breath sounds.  Abdominal:     General: Bowel sounds are normal.     Palpations: Abdomen is soft.  Musculoskeletal:        General: Normal range of motion.  Cervical back: Normal range of motion and neck supple.     Right lower leg: No edema.     Left lower leg: No edema.  Lymphadenopathy:     Cervical: No cervical adenopathy.  Skin:    General: Skin is warm and dry.  Neurological:     Mental Status: He is alert and oriented to person, place, and time.  Psychiatric:        Attention and Perception: Attention normal.        Mood and Affect: Mood normal.        Speech: Speech normal.        Behavior: Behavior normal. Behavior is cooperative.        Thought Content: Thought content normal.        Judgment: Judgment normal.     Results for orders placed or performed in  visit on 05/17/19  Novel Coronavirus, NAA ([facility])  Result Value Ref Range   SARS-CoV-2, NAA Not Detected Not Detected      Assessment & Plan:   Problem List Items Addressed This Visit      Endocrine   Poorly controlled type 2 diabetes mellitus ([place])    Chronic, ongoing poorly controlled with November A1C at [facility] 9.6%.  Ozempic initial dose caused some GI upset, but he wishes to continue to trial and see if side effects improve.  He agrees to check BS daily in morning, which he has not consistently been doing, due to concern for hypoglycemia.  Continue remainder of medication regimen.  Discussed possibility of insulin reduction in future if benefit from [place].  Return in March and will check A1C, if not performed at [facility].  Return sooner if poor tolerance Ozempic.          Follow up plan: Return in about 2 months (around 02/25/2020) for T2DM, HTN/HLD, PTSD.

## 2019-12-28 NOTE — Assessment & Plan Note (Signed)
Chronic, ongoing poorly controlled with November A1C at [facility] 9.6%.  Ozempic initial dose caused some GI upset, but he wishes to continue to trial and see if side effects improve.  He agrees to check BS daily in morning, which he has not consistently been doing, due to concern for hypoglycemia.  Continue remainder of medication regimen.  Discussed possibility of insulin reduction in future if benefit from [place].  Return in March and will check A1C, if not performed at [facility].  Return sooner if poor tolerance Ozempic.

## 2019-12-28 NOTE — Patient Instructions (Signed)
Carbohydrate Counting for Diabetes Mellitus, Adult  Carbohydrate counting is a method of keeping track of how many carbohydrates you eat. Eating carbohydrates naturally increases the amount of sugar (glucose) in the blood. Counting how many carbohydrates you eat helps keep your blood glucose within normal limits, which helps you manage your diabetes (diabetes mellitus). It is important to know how many carbohydrates you can safely have in each meal. This is different for every person. A diet and nutrition specialist (registered dietitian) can help you make a meal plan and calculate how many carbohydrates you should have at each meal and snack. Carbohydrates are found in the following foods:  Grains, such as breads and cereals.  Dried beans and soy products.  Starchy vegetables, such as potatoes, peas, and corn.  Fruit and fruit juices.  Milk and yogurt.  Sweets and snack foods, such as cake, cookies, candy, chips, and soft drinks. How do I count carbohydrates? There are two ways to count carbohydrates in food. You can use either of the methods or a combination of both. Reading "Nutrition Facts" on packaged food The "Nutrition Facts" list is included on the labels of almost all packaged foods and beverages in the [place] It includes:  The serving size.  Information about nutrients in each serving, including the grams (g) of carbohydrate per serving. To use the "Nutrition Facts":  Decide how many servings you will have.  Multiply the number of servings by the number of carbohydrates per serving.  The resulting number is the total amount of carbohydrates that you will be having. Learning standard serving sizes of other foods When you eat carbohydrate foods that are not packaged or do not include "Nutrition Facts" on the label, you need to measure the servings in order to count the amount of carbohydrates:  Measure the foods that you will eat with a food scale or measuring cup, if  needed.  Decide how many standard-size servings you will eat.  Multiply the number of servings by 15. Most carbohydrate-rich foods have about 15 g of carbohydrates per serving. ? For example, if you eat 8 oz (170 g) of strawberries, you will have eaten 2 servings and 30 g of carbohydrates (2 servings x 15 g = 30 g).  For foods that have more than one food mixed, such as soups and casseroles, you must count the carbohydrates in each food that is included. The following list contains standard serving sizes of common carbohydrate-rich foods. Each of these servings has about 15 g of carbohydrates:   hamburger bun or  English muffin.   oz (15 mL) syrup.   oz (14 g) jelly.  1 slice of bread.  1 six-inch tortilla.  3 oz (85 g) cooked rice or pasta.  4 oz (113 g) cooked dried beans.  4 oz (113 g) starchy vegetable, such as peas, corn, or potatoes.  4 oz (113 g) hot cereal.  4 oz (113 g) mashed potatoes or  of a large baked potato.  4 oz (113 g) canned or frozen fruit.  4 oz (120 mL) fruit juice.  4-6 crackers.  6 chicken nuggets.  6 oz (170 g) unsweetened dry cereal.  6 oz (170 g) plain fat-free yogurt or yogurt sweetened with artificial sweeteners.  8 oz (240 mL) milk.  8 oz (170 g) fresh fruit or one small piece of fruit.  24 oz (680 g) popped popcorn. Example of carbohydrate counting Sample meal  3 oz (85 g) chicken breast.  6 oz (170 g)   brown rice.  4 oz (113 g) corn.  8 oz (240 mL) milk.  8 oz (170 g) strawberries with sugar-free whipped topping. Carbohydrate calculation 1. Identify the foods that contain carbohydrates: ? Rice. ? Corn. ? Milk. ? Strawberries. 2. Calculate how many servings you have of each food: ? 2 servings rice. ? 1 serving corn. ? 1 serving milk. ? 1 serving strawberries. 3. Multiply each number of servings by 15 g: ? 2 servings rice x 15 g = 30 g. ? 1 serving corn x 15 g = 15 g. ? 1 serving milk x 15 g = 15 g. ? 1  serving strawberries x 15 g = 15 g. 4. Add together all of the amounts to find the total grams of carbohydrates eaten: ? 30 g + 15 g + 15 g + 15 g = 75 g of carbohydrates total. Summary  Carbohydrate counting is a method of keeping track of how many carbohydrates you eat.  Eating carbohydrates naturally increases the amount of sugar (glucose) in the blood.  Counting how many carbohydrates you eat helps keep your blood glucose within normal limits, which helps you manage your diabetes.  A diet and nutrition specialist (registered dietitian) can help you make a meal plan and calculate how many carbohydrates you should have at each meal and snack. This information is not intended to replace advice given to you by your health care provider. Make sure you discuss any questions you have with your health care provider. Document Revised: 06/23/2017 Document Reviewed: 05/12/2016 Elsevier Patient Education  2020 Elsevier Inc.  

## 2020-01-30 ENCOUNTER — Encounter: Payer: Self-pay | Admitting: Nurse Practitioner

## 2020-02-01 ENCOUNTER — Ambulatory Visit: Payer: Self-pay | Admitting: Pharmacist

## 2020-02-01 ENCOUNTER — Telehealth: Payer: Self-pay | Admitting: Pharmacist

## 2020-02-01 DIAGNOSIS — E1165 Type 2 diabetes mellitus with hyperglycemia: Secondary | ICD-10-CM

## 2020-02-01 DIAGNOSIS — Z6836 Body mass index (BMI) 36.0-36.9, adult: Secondary | ICD-10-CM

## 2020-02-01 NOTE — Telephone Encounter (Signed)
Could we please fax [name]'s visit note from 12/28/19, patient's MyChart encounter from 01/30/20, and a letter that I am emailing to [name] to be printed on clinic letterhead to the [facility] at [phone], Audrie Lia PACT Team 2A? Thanks!

## 2020-02-01 NOTE — Chronic Care Management (AMB) (Signed)
Chronic Care Management   Follow Up Note   02/01/2020 Name: Luis Hatfield. MRN: [id number] DOB: Nov 12, 1966  Referred by: Venita Lick, NP Reason for referral : Chronic Care Management (Medication Management)   Luis Hatfield. is a 54 y.o. year old male who is a primary care patient of Cannady, Barbaraann Faster, NP. The CCM team was consulted for assistance with chronic disease management and care coordination needs.    Care coordination completed today.   Review of patient status, including review of consultants reports, relevant laboratory and other test results, and collaboration with appropriate care team members and the patient's provider was performed as part of comprehensive patient evaluation and provision of chronic care management services.    SDOH (Social Determinants of Health) assessments performed: Yes SDOH Interventions     Most Recent Value  SDOH Interventions  SDOH Interventions for the Following Domains  Financial Strain  Financial Strain Interventions  Other (Comment) [Collaborated w/ [facility] provider]       Outpatient Encounter Medications as of 02/01/2020  Medication Sig Note  . albuterol (VENTOLIN HFA) 108 (90 Base) MCG/ACT inhaler Inhale into the lungs as needed.    Marland Kitchen atorvastatin (LIPITOR) 80 MG tablet Take 80 mg by mouth daily.    . cetirizine (ZYRTEC) 10 MG tablet    . cholestyramine (QUESTRAN) 4 g packet Take 1 packet (4 g total) by mouth 3 (three) times daily with meals.   Marland Kitchen glipiZIDE (GLUCOTROL) 10 MG tablet glipizide 10 mg tablet   . hydrocortisone ointment 0.5 % Apply 1 application topically 2 (two) times daily.   Marland Kitchen ibuprofen (ADVIL,MOTRIN) 200 MG tablet Take by mouth.   Marland Kitchen LANTUS 100 UNIT/ML injection  11/23/2019: Taking 50 units QPM  . metFORMIN (GLUCOPHAGE) 1000 MG tablet Take 1,000 mg by mouth 2 (two) times daily.    . niacin 100 MG tablet    . omeprazole (PRILOSEC) 20 MG capsule    . prazosin (MINIPRESS) 1 MG capsule Take 1 mg by  mouth.    . selenium sulfide (SELSUN) 2.5 % shampoo as needed.    . Semaglutide,0.25 or 0.5MG /DOS, (OZEMPIC, 0.25 OR 0.5 MG/DOSE,) 2 MG/1.5ML SOPN Inject 0.25 mg into the skin once a week. Start with 0.25MG  once a week x 4 weeks, then increase to 0.5MG  weekly.   . sertraline (ZOLOFT) 100 MG tablet Take 100 mg by mouth daily.    No facility-administered encounter medications on file as of 02/01/2020.     Objective:   Goals Addressed            This Visit's Progress     Patient Stated   . PharmD "I want to control my sugars better" (pt-stated)       Current Barriers:  . Diabetes: uncontrolled; complicated by chronic medical conditions including HLD, OSA, PTSD, most recent A1c in 8-9% per his report at [facility] recently o Contacts the office noting that his Ozempic is now too expensive on commercial insurance . Current antihyperglycemic regimen: metformin 1000 mg BID, glipizide 10 mg (patient unsure of dose), Lantus 50 units daily, Ozempic 0.5 mg weekly . Denies hypoglycemic episodes  . Current meal patterns: . Current exercise: none besides walking his dog; limited by back pain  . Current blood glucose readings: Reports readings now in 160-180s . Cardiovascular risk reduction: o Current hypertensive regimen: prazosin 1 mg QPM (though for PTSD/nightmares) o Current hyperlipidemia regimen: atorvastatin 80 mg daily, niacin 500 mg daily   Pharmacist Clinical Goal(s):  .  Over the next 90 days, patient will work with PharmD and primary care provider to address optimized medication management  Interventions: . Attempted to contact the [facility]. Was immediately told that they could not speak to me if I was not the patient. Any information must come from a fax to [phone] . Will collaborate w/ office staff to fax a letter from me and last visit note from PCP   Patient Self Care Activities:  . Patient will check blood glucose daily-BID , document, and provide at future  appointments . Patient will take medications as prescribed . Patient will report any questions or concerns to provider   Please see past updates related to this goal by clicking on the "Past Updates" button in the selected goal          Plan:  - Will collaborate w/ patient and clinical staff as above  Catie [name], PharmD, [place] [phone]

## 2020-02-01 NOTE — Patient Instructions (Signed)
Visit Information  Goals Addressed            This Visit's Progress     Patient Stated   . PharmD "I want to control my sugars better" (pt-stated)       Current Barriers:  . Diabetes: uncontrolled; complicated by chronic medical conditions including HLD, OSA, PTSD, most recent A1c in 8-9% per his report at [facility] recently o Contacts the office noting that his Ozempic is now too expensive on commercial insurance . Current antihyperglycemic regimen: metformin 1000 mg BID, glipizide 10 mg (patient unsure of dose), Lantus 50 units daily, Ozempic 0.5 mg weekly . Denies hypoglycemic episodes  . Current meal patterns: . Current exercise: none besides walking his dog; limited by back pain  . Current blood glucose readings: Reports readings now in 160-180s . Cardiovascular risk reduction: o Current hypertensive regimen: prazosin 1 mg QPM (though for PTSD/nightmares) o Current hyperlipidemia regimen: atorvastatin 80 mg daily, niacin 500 mg daily   Pharmacist Clinical Goal(s):  Marland Kitchen Over the next 90 days, patient will work with PharmD and primary care provider to address optimized medication management  Interventions: . Attempted to contact the [facility]. Was immediately told that they could not speak to me if I was not the patient. Any information must come from a fax to [phone] . Will collaborate w/ office staff to fax a letter from me and last visit note from PCP   Patient Self Care Activities:  . Patient will check blood glucose daily-BID , document, and provide at future appointments . Patient will take medications as prescribed . Patient will report any questions or concerns to provider   Please see past updates related to this goal by clicking on the "Past Updates" button in the selected goal         Patient verbalizes understanding of instructions provided today.  Plan:  - Will collaborate w/ patient and clinical staff as above  Catie [name], PharmD,  [place] [phone]

## 2020-02-05 NOTE — Telephone Encounter (Signed)
This was completed, correct?

## 2020-02-06 NOTE — Telephone Encounter (Signed)
Completed on 02/05/20

## 2020-02-12 ENCOUNTER — Ambulatory Visit: Payer: Self-pay

## 2020-02-20 ENCOUNTER — Ambulatory Visit: Payer: Self-pay

## 2020-02-20 NOTE — Chronic Care Management (AMB) (Signed)
  Care Management   Follow Up Note   02/20/2020 Name: Luis Hatfield. MRN: [id number] DOB: Apr 26, 1966  Referred by: Venita Lick, NP Reason for referral : Care Coordination   Luis Hatfield. is a 54 y.o. year old male who is a primary care patient of Cannady, Barbaraann Faster, NP. The care management team was consulted for assistance with care management and care coordination needs.    Review of patient status, including review of consultants reports, relevant laboratory and other test results, and collaboration with appropriate care team members and the patient's provider was performed as part of comprehensive patient evaluation and provision of chronic care management services.     LCSW completed CCM outreach attempt today but was unable to reach patient successfully. A HIPPA compliant voice message was left encouraging patient to return call once available. LCSW rescheduled CCM SW appointment as well.  A HIPPA compliant phone message was left for the patient providing contact information and requesting a return call.   Eula Fried, BSW, MSW, [facility] Care Management [email or web address] Phone: [phone]

## 2020-03-07 ENCOUNTER — Other Ambulatory Visit: Payer: Self-pay

## 2020-03-07 ENCOUNTER — Encounter: Payer: Self-pay | Admitting: Nurse Practitioner

## 2020-03-07 ENCOUNTER — Ambulatory Visit (INDEPENDENT_AMBULATORY_CARE_PROVIDER_SITE_OTHER): Payer: Managed Care, Other (non HMO) | Admitting: Nurse Practitioner

## 2020-03-07 VITALS — BP 133/77 | HR 87 | Temp 99.4°F | Ht 71.0 in | Wt 258.0 lb

## 2020-03-07 DIAGNOSIS — E1169 Type 2 diabetes mellitus with other specified complication: Secondary | ICD-10-CM

## 2020-03-07 DIAGNOSIS — R030 Elevated blood-pressure reading, without diagnosis of hypertension: Secondary | ICD-10-CM

## 2020-03-07 DIAGNOSIS — E781 Pure hyperglyceridemia: Secondary | ICD-10-CM

## 2020-03-07 DIAGNOSIS — M1990 Unspecified osteoarthritis, unspecified site: Secondary | ICD-10-CM | POA: Insufficient documentation

## 2020-03-07 DIAGNOSIS — F4312 Post-traumatic stress disorder, chronic: Secondary | ICD-10-CM

## 2020-03-07 DIAGNOSIS — E1165 Type 2 diabetes mellitus with hyperglycemia: Secondary | ICD-10-CM | POA: Diagnosis not present

## 2020-03-07 DIAGNOSIS — E785 Hyperlipidemia, unspecified: Secondary | ICD-10-CM

## 2020-03-07 NOTE — Progress Notes (Signed)
BP 133/77 (BP Location: Right Arm, Patient Position: Sitting, Cuff Size: Normal)   Pulse 87   Temp 99.4 F (37.4 C) (Oral)   Ht 5\' 11"  (1.803 m)   Wt 258 lb (117 kg)   SpO2 96%   BMI 35.98 kg/m    Subjective:    Patient ID: Luis Craze., male    DOB: 02-23-1966, 54 y.o.   MRN: [id number]  HPI: Luis Cimo. is a 54 y.o. male  Chief Complaint  Patient presents with  . Diabetes  . Hypertension  . Hyperlipidemia  . Post-Traumatic Stress Disorder   DIABETES Continues on Ozempic, Metformin, Glipizide, and Lantus.  Last A1Con Monday at [facility] was 7.1%, prior in November was 9.6%.  At last visit fasting blood sugars were in 180's, some change noted with initial Ozempic.  Was told at [place] he has neuropathy in hands, was started on high dose Vitamin D at [place].  Has been out of Ozempic for over a month, as can not afford $150 a month, is working on getting this through the [place].   Hypoglycemic episodes:no Polydipsia/polyuria: no Visual disturbance: no Chest pain: no Paresthesias: no Glucose Monitoring: yes  Accucheck frequency: every other day  Fasting glucose: 100 range, on Ozempic was 80-90  Post prandial:  Evening:  Before meals: Taking Insulin?:yes Long acting insulin: Lantus 50 units Short acting insulin: Blood Pressure Monitoring:not checking Retinal Examination:Up to Date, at [place] to Date Pneumovax:Up to Date Influenza:Up to Date Aspirin:yes   HYPERLIPIDEMIA Has history of trigs in 575 and 946 on review, is on Niacin and Simvastatin. Followed by PCP at the [facility] in [place] and had recent labs at [place].   Hyperlipidemia status:good compliance Satisfied with current treatment?yes Side effects:no Medication compliance:good compliance Past cholesterol meds:he is unsure Supplements:none Aspirin:yes The 10-year ASCVD risk score Mikey Bussing DC Jr., et al., 2013) is: 12.7%   Values used to calculate the score:      Age: 30 years     Sex: Male     Is Non-Hispanic African American: No     Diabetic: Yes     Tobacco smoker: No     Systolic Blood Pressure: Q000111Q mmHg     Is BP treated: No     HDL Cholesterol: 29 mg/dL     Total Cholesterol: 159 mg/dL   HYPERTENSION Not currently on any medications.  Discussed with him and wrote down Lisinopril and Losartan to discuss with his [place] provider, which would also offer kidney protection in diabetes.  Educated him on this.   Hypertension status: stable  Satisfied with current treatment? yes Aspirin: no Recurrent headaches: no Visual changes: no Palpitations: no Dyspnea: no, on warm, humid days Chest pain: no Lower extremity edema: no Dizzy/lightheaded: no  PTSD From [employer] time.  Followed by [place] PCP and psychiatry + therapy.  Is retiring in 65 days and is excited and fearful about this.  This time of year is also a trigger time for him.  He is in an empty house now, wife has moved out, has a lot of animals in house.   Duration:stable Anxious mood: yes  Excessive worrying: no Irritability: no  Sweating: no Nausea: no Palpitations:no Hyperventilation: no Panic attacks: no Agoraphobia: no  Obscessions/compulsions: no Depressed mood: yes Depression screen [facility] 2/9 03/07/2020 10/26/2019  Decreased Interest 2 3  Down, Depressed, Hopeless 3 3  PHQ - 2 Score 5 6  Altered sleeping 2 3  Tired, decreased energy 2 3  Change in appetite 2 1  Feeling bad or failure about yourself  2 3  Trouble concentrating 2 2  Moving slowly or fidgety/restless 0 1  Suicidal thoughts 0 0  PHQ-9 Score 15 19  Difficult doing work/chores Somewhat difficult Somewhat difficult   Anhedonia: no Weight changes: no Insomnia: yes hard to fall asleep  Hypersomnia: no Fatigue/loss of energy: no Feelings of worthlessness: no Feelings of guilt: no Impaired concentration/indecisiveness: yes Suicidal ideations: no  Crying spells: no Recent Stressors/Life Changes: yes    Relationship problems: no   Family stress: no     Financial stress: no    Job stress: yes    Recent death/loss: no GAD 7 : Generalized Anxiety Score 03/07/2020 10/26/2019  Nervous, Anxious, on Edge 2 3  Control/stop worrying 1 3  Worry too much - different things 1 3  Trouble relaxing 2 2  Restless 0 1  Easily annoyed or irritable 1 3  Afraid - awful might happen 0 3  Total GAD 7 Score 7 18  Anxiety Difficulty Somewhat difficult Somewhat difficult    Relevant past medical, surgical, family and social history reviewed and updated as indicated. Interim medical history since our last visit reviewed. Allergies and medications reviewed and updated.  Review of Systems  Constitutional: Negative for activity change, diaphoresis, fatigue and fever.  Respiratory: Negative for cough, chest tightness, shortness of breath and wheezing.   Cardiovascular: Negative for chest pain, palpitations and leg swelling.  Endocrine: Negative for polydipsia, polyphagia and polyuria.  Psychiatric/Behavioral: Negative.     Per HPI unless specifically indicated above     Objective:    BP 133/77 (BP Location: Right Arm, Patient Position: Sitting, Cuff Size: Normal)   Pulse 87   Temp 99.4 F (37.4 C) (Oral)   Ht 5\' 11"  (1.803 m)   Wt 258 lb (117 kg)   SpO2 96%   BMI 35.98 kg/m   Wt Readings from Last 3 Encounters:  03/07/20 258 lb (117 kg)  10/26/19 260 lb (117.9 kg)  06/14/19 262 lb 8 oz (119.1 kg)    Physical Exam Vitals and nursing note reviewed.  Constitutional:      General: He is awake. He is not in acute distress.    Appearance: He is well-developed. He is obese. He is not ill-appearing.  HENT:     Head: Normocephalic and atraumatic.     Right Ear: Hearing normal. No drainage.     Left Ear: Hearing normal. No drainage.  Eyes:     General: Lids are normal.        Right eye: No discharge.        Left eye: No discharge.     Conjunctiva/sclera: Conjunctivae normal.     Pupils: Pupils  are equal, round, and reactive to light.  Neck:     Thyroid: No thyromegaly.     Vascular: No carotid bruit.  Cardiovascular:     Rate and Rhythm: Normal rate and regular rhythm.     Heart sounds: Normal heart sounds, S1 normal and S2 normal. No murmur. No gallop.   Pulmonary:     Effort: Pulmonary effort is normal. No accessory muscle usage or respiratory distress.     Breath sounds: Normal breath sounds.  Abdominal:     General: Bowel sounds are normal.     Palpations: Abdomen is soft.  Musculoskeletal:        General: Normal range of motion.     Cervical back: Normal range of motion and  neck supple.     Right lower leg: No edema.     Left lower leg: No edema.  Lymphadenopathy:     Cervical: No cervical adenopathy.  Skin:    General: Skin is warm and dry.  Neurological:     Mental Status: He is alert and oriented to person, place, and time.  Psychiatric:        Attention and Perception: Attention normal.        Mood and Affect: Mood normal.        Speech: Speech normal.        Behavior: Behavior normal. Behavior is cooperative.        Thought Content: Thought content normal.        Judgment: Judgment normal.     Results for orders placed or performed in visit on 05/17/19  Novel Coronavirus, NAA ([facility])  Result Value Ref Range   SARS-CoV-2, NAA Not Detected Not Detected      Assessment & Plan:   Problem List Items Addressed This Visit      Endocrine   Poorly controlled type 2 diabetes mellitus ([place]) - Primary    Chronic, with improvement in A1C to 7.1% with Ozempic, has been out of this for month now while waiting to obtain via [place].  Continue current medication regimen and consider reduction in future.  He agrees to check BS daily in morning, which he has not consistently been doing, due to concern for hypoglycemia. Discussed possibility of insulin reduction in future if benefit from [place].  Return in June and will check A1C, if not performed at [facility].  Continue  collaboration with [place] PCP.      Hyperlipidemia associated with type 2 diabetes mellitus (HCC)    Chronic, ongoing.  Continue Simvastatin and Niacin + collaboration with [facility], to send his recent labs via [place].        Other   Morbid obesity due to excess calories ([place])    With BMI 35.98 and T2DM + HTN.  Recommend continued focus on health diet choices and regular physical activity (30 minutes 5 days a week).       Hypertriglyceridemia    Continue current medication regimen and collaboration with [facility].  Review labs from [place] once patient provides.      Relevant Medications   sildenafil (VIAGRA) 50 MG tablet   Chronic post-traumatic stress disorder (PTSD)    Followed by [facility] and [place] psychiatry.  Continue Sertraline and Prazosin daily + collaborate with [facility] and psychiatry.        Elevated BP without diagnosis of hypertension    Noted on exam past visit, at goal today, due to diabetes did recommend he speak to his [place] PCP about adding on a low dose of Lisinopril or Losartan for kidney protection and BP control.  He can obtain this at [facility] for free.  Monitor BP at home daily and document, DASH diet recommended.  Continue to monitor and adjust regimen as needed.          Follow up plan: Return in about 3 months (around 06/07/2020) for T2DM, HTN/HLD, MOOD.

## 2020-03-07 NOTE — Assessment & Plan Note (Signed)
Followed by [facility] and [place] psychiatry.  Continue Sertraline and Prazosin daily + collaborate with [facility] and psychiatry.

## 2020-03-07 NOTE — Assessment & Plan Note (Signed)
Chronic, ongoing.  Continue Simvastatin and Niacin + collaboration with [facility], to send his recent labs via [place].

## 2020-03-07 NOTE — Assessment & Plan Note (Signed)
Continue current medication regimen and collaboration with [facility].  Review labs from [place] once patient provides.

## 2020-03-07 NOTE — Assessment & Plan Note (Signed)
Chronic, with improvement in A1C to 7.1% with Ozempic, has been out of this for month now while waiting to obtain via [place].  Continue current medication regimen and consider reduction in future.  He agrees to check BS daily in morning, which he has not consistently been doing, due to concern for hypoglycemia. Discussed possibility of insulin reduction in future if benefit from [place].  Return in June and will check A1C, if not performed at [facility].  Continue collaboration with [place] PCP.

## 2020-03-07 NOTE — Assessment & Plan Note (Signed)
Noted on exam past visit, at goal today, due to diabetes did recommend he speak to his [place] PCP about adding on a low dose of Lisinopril or Losartan for kidney protection and BP control.  He can obtain this at [facility] for free.  Monitor BP at home daily and document, DASH diet recommended.  Continue to monitor and adjust regimen as needed.

## 2020-03-07 NOTE — Patient Instructions (Signed)
Carbohydrate Counting for Diabetes Mellitus, Adult  Carbohydrate counting is a method of keeping track of how many carbohydrates you eat. Eating carbohydrates naturally increases the amount of sugar (glucose) in the blood. Counting how many carbohydrates you eat helps keep your blood glucose within normal limits, which helps you manage your diabetes (diabetes mellitus). It is important to know how many carbohydrates you can safely have in each meal. This is different for every person. A diet and nutrition specialist (registered dietitian) can help you make a meal plan and calculate how many carbohydrates you should have at each meal and snack. Carbohydrates are found in the following foods:  Grains, such as breads and cereals.  Dried beans and soy products.  Starchy vegetables, such as potatoes, peas, and corn.  Fruit and fruit juices.  Milk and yogurt.  Sweets and snack foods, such as cake, cookies, candy, chips, and soft drinks. How do I count carbohydrates? There are two ways to count carbohydrates in food. You can use either of the methods or a combination of both. Reading "Nutrition Facts" on packaged food The "Nutrition Facts" list is included on the labels of almost all packaged foods and beverages in the [place] It includes:  The serving size.  Information about nutrients in each serving, including the grams (g) of carbohydrate per serving. To use the "Nutrition Facts":  Decide how many servings you will have.  Multiply the number of servings by the number of carbohydrates per serving.  The resulting number is the total amount of carbohydrates that you will be having. Learning standard serving sizes of other foods When you eat carbohydrate foods that are not packaged or do not include "Nutrition Facts" on the label, you need to measure the servings in order to count the amount of carbohydrates:  Measure the foods that you will eat with a food scale or measuring cup, if  needed.  Decide how many standard-size servings you will eat.  Multiply the number of servings by 15. Most carbohydrate-rich foods have about 15 g of carbohydrates per serving. ? For example, if you eat 8 oz (170 g) of strawberries, you will have eaten 2 servings and 30 g of carbohydrates (2 servings x 15 g = 30 g).  For foods that have more than one food mixed, such as soups and casseroles, you must count the carbohydrates in each food that is included. The following list contains standard serving sizes of common carbohydrate-rich foods. Each of these servings has about 15 g of carbohydrates:   hamburger bun or  English muffin.   oz (15 mL) syrup.   oz (14 g) jelly.  1 slice of bread.  1 six-inch tortilla.  3 oz (85 g) cooked rice or pasta.  4 oz (113 g) cooked dried beans.  4 oz (113 g) starchy vegetable, such as peas, corn, or potatoes.  4 oz (113 g) hot cereal.  4 oz (113 g) mashed potatoes or  of a large baked potato.  4 oz (113 g) canned or frozen fruit.  4 oz (120 mL) fruit juice.  4-6 crackers.  6 chicken nuggets.  6 oz (170 g) unsweetened dry cereal.  6 oz (170 g) plain fat-free yogurt or yogurt sweetened with artificial sweeteners.  8 oz (240 mL) milk.  8 oz (170 g) fresh fruit or one small piece of fruit.  24 oz (680 g) popped popcorn. Example of carbohydrate counting Sample meal  3 oz (85 g) chicken breast.  6 oz (170 g)   brown rice.  4 oz (113 g) corn.  8 oz (240 mL) milk.  8 oz (170 g) strawberries with sugar-free whipped topping. Carbohydrate calculation 1. Identify the foods that contain carbohydrates: ? Rice. ? Corn. ? Milk. ? Strawberries. 2. Calculate how many servings you have of each food: ? 2 servings rice. ? 1 serving corn. ? 1 serving milk. ? 1 serving strawberries. 3. Multiply each number of servings by 15 g: ? 2 servings rice x 15 g = 30 g. ? 1 serving corn x 15 g = 15 g. ? 1 serving milk x 15 g = 15 g. ? 1  serving strawberries x 15 g = 15 g. 4. Add together all of the amounts to find the total grams of carbohydrates eaten: ? 30 g + 15 g + 15 g + 15 g = 75 g of carbohydrates total. Summary  Carbohydrate counting is a method of keeping track of how many carbohydrates you eat.  Eating carbohydrates naturally increases the amount of sugar (glucose) in the blood.  Counting how many carbohydrates you eat helps keep your blood glucose within normal limits, which helps you manage your diabetes.  A diet and nutrition specialist (registered dietitian) can help you make a meal plan and calculate how many carbohydrates you should have at each meal and snack. This information is not intended to replace advice given to you by your health care provider. Make sure you discuss any questions you have with your health care provider. Document Revised: 06/23/2017 Document Reviewed: 05/12/2016 Elsevier Patient Education  2020 Elsevier Inc.  

## 2020-03-07 NOTE — Assessment & Plan Note (Signed)
With BMI 35.98 and T2DM + HTN.  Recommend continued focus on health diet choices and regular physical activity (30 minutes 5 days a week).

## 2020-04-07 ENCOUNTER — Other Ambulatory Visit: Payer: Self-pay | Admitting: Gastroenterology

## 2020-06-13 ENCOUNTER — Ambulatory Visit: Payer: Managed Care, Other (non HMO) | Admitting: Nurse Practitioner

## 2020-09-16 ENCOUNTER — Other Ambulatory Visit: Payer: Self-pay

## 2020-09-16 ENCOUNTER — Ambulatory Visit
Admission: EM | Admit: 2020-09-16 | Discharge: 2020-09-16 | Disposition: A | Discharge status: Home / Self Care | Payer: Non-veteran care | Attending: Emergency Medicine | Admitting: Emergency Medicine

## 2020-09-16 DIAGNOSIS — M549 Dorsalgia, unspecified: Secondary | ICD-10-CM | POA: Diagnosis not present

## 2020-09-16 MED ORDER — CYCLOBENZAPRINE HCL 10 MG PO TABS: 10.0000 mg | ORAL_TABLET | Freq: Two times a day (BID) | ORAL | 0 refills | Status: DC | PRN

## 2020-09-16 NOTE — Discharge Instructions (Addendum)
Continue taking over-the-counter ibuprofen as needed for your pain.  Take the muscle relaxer Flexeril as needed for muscle spasm; Do not drive, operate machinery, or drink alcohol with this medication as it may make you drowsy.    Follow up with your primary care provider or an orthopedist if your pain is not improving.
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
## 2020-09-16 NOTE — ED Triage Notes (Signed)
Patient reports he started having upper back pain 3 weeks ago. Reports his headaches have gotten worse since the back pain started. Patient also reports the palm and interior side of his right forearm started going numb last night.

## 2020-11-18 IMAGING — NM NUCLEAR MEDICINE GASTRIC EMPTYING STUDY
1 series · 10 of 10 positions shown · non-contrast
Comparison: None.

CLINICAL DATA: Nausea and vomiting.

EXAM:
NUCLEAR MEDICINE GASTRIC EMPTYING SCAN
TECHNIQUE: After oral ingestion of radiolabeled meal, sequential abdominal
images were obtained for 4 hours. Percentage of activity emptying
the stomach was calculated at 1 hour, 2 hour, 3 hour, and 4 hours.
RADIOPHARMACEUTICALS:  2.82 mCi 0c-77m sulfur colloid in
standardized meal

[Series 1000: gatric statics (results) · 3.90mm/px · 5 acquisitions, 10 frames shown]
[im 1/5]
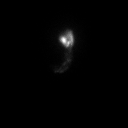
[im 1/5]
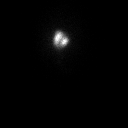
[im 2/5]
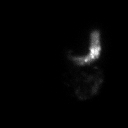
[im 2/5]
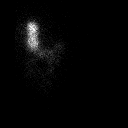
[im 3/5]
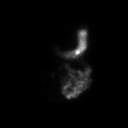
[im 3/5]
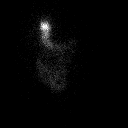
[im 4/5]
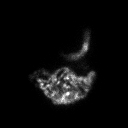
[im 4/5]
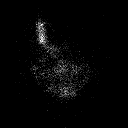
[im 5/5]
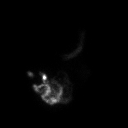
[im 5/5]
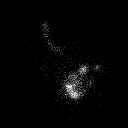

[10 of 10 positions shown; findings below may reference images not displayed]

FINDINGS: Expected location of the stomach in the left upper quadrant.
Ingested meal empties the stomach gradually over the course of the
study.

43% emptied at 1 hr ( normal >= 10%)

63% emptied at 2 hr ( normal >= 40%)

87% emptied at 3 hr ( normal >= 70%)

94% emptied at 4 hr ( normal >= 90%)
IMPRESSION: Normal gastric emptying study.

## 2022-01-22 ENCOUNTER — Other Ambulatory Visit: Payer: Self-pay | Admitting: Neurosurgery

## 2022-01-22 DIAGNOSIS — Z01818 Encounter for other preprocedural examination: Secondary | ICD-10-CM

## 2022-02-02 ENCOUNTER — Other Ambulatory Visit
Admission: RE | Admit: 2022-02-02 | Discharge: 2022-02-02 | Disposition: A | Discharge status: Home / Self Care | Payer: No Typology Code available for payment source | Source: Ambulatory Visit | Attending: Neurosurgery | Admitting: Neurosurgery

## 2022-02-02 ENCOUNTER — Other Ambulatory Visit: Payer: Self-pay

## 2022-02-02 DIAGNOSIS — Z01818 Encounter for other preprocedural examination: Secondary | ICD-10-CM | POA: Insufficient documentation

## 2022-02-02 HISTORY — DX: Depression, unspecified: F32.A

## 2022-02-02 HISTORY — DX: Anxiety disorder, unspecified: F41.9

## 2022-02-02 HISTORY — DX: Malignant (primary) neoplasm, unspecified: C80.1

## 2022-02-02 LAB — TYPE AND SCREEN
ABO/RH(D): A NEG
Antibody Screen: NEGATIVE

## 2022-02-02 LAB — CBC
HCT: 46.9 % (ref 39.0–52.0)
Hemoglobin: 15.9 g/dL (ref 13.0–17.0)
MCH: 28.4 pg (ref 26.0–34.0)
MCHC: 33.9 g/dL (ref 30.0–36.0)
MCV: 83.9 fL (ref 80.0–100.0)
Platelets: 147 10*3/uL — ABNORMAL LOW (ref 150–400)
RBC: 5.59 MIL/uL (ref 4.22–5.81)
RDW: 13.1 % (ref 11.5–15.5)
WBC: 6.9 10*3/uL (ref 4.0–10.5)
nRBC: 0 % (ref 0.0–0.2)

## 2022-02-02 LAB — URINALYSIS, ROUTINE W REFLEX MICROSCOPIC
Bacteria, UA: NONE SEEN
Bilirubin Urine: NEGATIVE
Glucose, UA: >=500 mg/dL — AB
Hgb urine dipstick: NEGATIVE
Ketones, ur: NEGATIVE mg/dL
Leukocytes,Ua: NEGATIVE
Nitrite: NEGATIVE
Protein, ur: NEGATIVE mg/dL
Specific Gravity, Urine: 1.023 (ref 1.005–1.030)
Squamous Epithelial / HPF: NONE SEEN (ref 0–5)
WBC, UA: NONE SEEN WBC/hpf (ref 0–5)
pH: 5 (ref 5.0–8.0)

## 2022-02-02 LAB — BASIC METABOLIC PANEL
Anion gap: 12 (ref 5–15)
BUN: 12 mg/dL (ref 6–20)
CO2: 26 mmol/L (ref 22–32)
Calcium: 9.8 mg/dL (ref 8.9–10.3)
Chloride: 96 mmol/L — ABNORMAL LOW (ref 98–111)
Creatinine, Ser: 0.86 mg/dL (ref 0.61–1.24)
GFR, Estimated: >60 mL/min (ref >60)
Glucose, Bld: 348 mg/dL — ABNORMAL HIGH (ref 70–99)
Potassium: 4 mmol/L (ref 3.5–5.1)
Sodium: 134 mmol/L — ABNORMAL LOW (ref 135–145)

## 2022-02-02 LAB — SURGICAL PCR SCREEN
MRSA, PCR: NEGATIVE
Staphylococcus aureus: NEGATIVE

## 2022-02-02 LAB — PROTIME-INR
INR: 1 (ref 0.8–1.2)
Prothrombin Time: 13.5 seconds (ref 11.4–15.2)

## 2022-02-02 LAB — APTT: aPTT: 36 seconds (ref 24–36)

## 2022-02-02 NOTE — Patient Instructions (Signed)
Your procedure is scheduled on: 02/12/22 Report to [facility]. To find out your arrival time please call [phone] between 1PM - 3PM on 02/11/22.  Remember: Instructions that are not followed completely may result in serious medical risk, up to and including death, or upon the discretion of your surgeon and anesthesiologist your surgery may need to be rescheduled.     _X__ 1. Do not eat food after midnight the night before your procedure.                 No gum chewing or hard candies. You may drink clear liquids up to 2 hours                 before you are scheduled to arrive for your surgery-  Diabetics water only  __X__2.  On the morning of surgery brush your teeth with toothpaste and water, you                 may rinse your mouth with mouthwash if you wish.  Do not swallow any              toothpaste of mouthwash.     _X__ 3.  No Alcohol for 24 hours before or after surgery.   _X__ 4.  Do Not Smoke or use e-cigarettes For 24 Hours Prior to Your Surgery.                 Do not use any chewable tobacco products for at least 6 hours prior to                 surgery.  ____  5.  Bring all medications with you on the day of surgery if instructed.   __X__  6.  Notify your doctor if there is any change in your medical condition      (cold, fever, infections).     Do not wear jewelry, make-up, hairpins, clips or nail polish. Do not wear lotions, powders, or perfumes. NO DEODORANT Do not shave body hair 48 hours prior to surgery. Men may shave face and neck. Do not bring valuables to the hospital.    [facility] is not responsible for any belongings or valuables.  Contacts, dentures/partials or body piercings may not be worn into surgery. Bring a case for your contacts, glasses or hearing aids, a denture cup will be supplied. Leave your suitcase in the car. After surgery it may be brought to your room. For patients admitted to the  hospital, discharge time is determined by your treatment team.   Patients discharged the day of surgery will not be allowed to drive home.   Please read over the following fact sheets that you were given:   MRSA Information, CHG soap  __X__ Take these medicines the morning of surgery with A SIP OF WATER:    1. cetirizine (ZYRTEC) 10 MG tablet  2. gabapentin (NEURONTIN) 200 MG capsule  3. omeprazole (PRILOSEC) 40 MG capsule  4. sertraline (ZOLOFT) 100 MG tablet  5.  6.  ____ Fleet Enema (as directed)   __X__ Use CHG Soap/SAGE wipes as directed  __X__ Use inhalers on the day of surgery  __X__ Stop  METFORMIN 2 DAYS BEFORE SURGERY (WED AM LAST DOSE) / STOP JARDIANCE 3 DAYS BEFORE SURGERY (LAST DOSE TUES)    __X__ Take 1/2 of usual insulin dose the night before surgery. No insulin the morning  of surgery.   ____ Stop Blood Thinners Coumadin/Plavix/Xarelto/Pleta/Pradaxa/Eliquis/Effient/Aspirin  on   Or contact your Surgeon, Cardiologist or Medical Doctor regarding  ability to stop your blood thinners  __X__ Stop Anti-inflammatories 7 days before surgery such as Advil, Ibuprofen, Motrin,  BC or Goodies Powder, Naprosyn, Naproxen, Aleve, Aspirin   YOU MAY TAKE TYLENOL IF NEEDED  __X__ Stop all herbals and supplements, fish oil or vitamins  until after surgery.    __X__ Bring C-Pap to the hospital.

## 2022-02-12 ENCOUNTER — Encounter: Payer: Self-pay | Admitting: Neurosurgery

## 2022-02-12 ENCOUNTER — Ambulatory Visit: Payer: No Typology Code available for payment source | Admitting: Anesthesiology

## 2022-02-12 ENCOUNTER — Encounter
Admission: RE | Disposition: A | Discharge status: Home / Self Care | Payer: Self-pay | Source: Home / Self Care | Attending: Neurosurgery

## 2022-02-12 ENCOUNTER — Other Ambulatory Visit: Payer: Self-pay

## 2022-02-12 ENCOUNTER — Ambulatory Visit
Admission: RE | Admit: 2022-02-12 | Discharge: 2022-02-12 | Disposition: A | Discharge status: Home / Self Care | Payer: No Typology Code available for payment source | Attending: Neurosurgery | Admitting: Neurosurgery

## 2022-02-12 ENCOUNTER — Ambulatory Visit: Payer: No Typology Code available for payment source

## 2022-02-12 ENCOUNTER — Ambulatory Visit: Payer: No Typology Code available for payment source | Admitting: Urgent Care

## 2022-02-12 DIAGNOSIS — F32A Depression, unspecified: Secondary | ICD-10-CM | POA: Diagnosis not present

## 2022-02-12 DIAGNOSIS — E785 Hyperlipidemia, unspecified: Secondary | ICD-10-CM | POA: Insufficient documentation

## 2022-02-12 DIAGNOSIS — F419 Anxiety disorder, unspecified: Secondary | ICD-10-CM | POA: Diagnosis not present

## 2022-02-12 DIAGNOSIS — G959 Disease of spinal cord, unspecified: Secondary | ICD-10-CM | POA: Insufficient documentation

## 2022-02-12 DIAGNOSIS — Z794 Long term (current) use of insulin: Secondary | ICD-10-CM | POA: Insufficient documentation

## 2022-02-12 DIAGNOSIS — G4730 Sleep apnea, unspecified: Secondary | ICD-10-CM | POA: Diagnosis not present

## 2022-02-12 DIAGNOSIS — G992 Myelopathy in diseases classified elsewhere: Secondary | ICD-10-CM | POA: Insufficient documentation

## 2022-02-12 DIAGNOSIS — Z7984 Long term (current) use of oral hypoglycemic drugs: Secondary | ICD-10-CM | POA: Insufficient documentation

## 2022-02-12 DIAGNOSIS — M1990 Unspecified osteoarthritis, unspecified site: Secondary | ICD-10-CM | POA: Insufficient documentation

## 2022-02-12 DIAGNOSIS — K219 Gastro-esophageal reflux disease without esophagitis: Secondary | ICD-10-CM | POA: Diagnosis not present

## 2022-02-12 DIAGNOSIS — M4802 Spinal stenosis, cervical region: Secondary | ICD-10-CM | POA: Insufficient documentation

## 2022-02-12 DIAGNOSIS — J449 Chronic obstructive pulmonary disease, unspecified: Secondary | ICD-10-CM | POA: Insufficient documentation

## 2022-02-12 DIAGNOSIS — E119 Type 2 diabetes mellitus without complications: Secondary | ICD-10-CM | POA: Diagnosis not present

## 2022-02-12 HISTORY — PX: ANTERIOR CERVICAL DECOMP/DISCECTOMY FUSION: SHX1161

## 2022-02-12 LAB — GLUCOSE, CAPILLARY
Glucose-Capillary: 230 mg/dL — ABNORMAL HIGH (ref 70–99)
Glucose-Capillary: 283 mg/dL — ABNORMAL HIGH (ref 70–99)

## 2022-02-12 LAB — ABO/RH: ABO/RH(D): A NEG

## 2022-02-12 SURGERY — ANTERIOR CERVICAL DECOMPRESSION/DISCECTOMY FUSION 2 LEVELS
Anesthesia: General

## 2022-02-12 MED ORDER — LIDOCAINE HCL (CARDIAC) PF 100 MG/5ML IV SOSY
PREFILLED_SYRINGE | INTRAVENOUS | Status: DC | PRN
Start: 2022-02-12 — End: 2022-02-12
  Administered 2022-02-12: 100 mg via INTRAVENOUS

## 2022-02-12 MED ORDER — PROPOFOL 1000 MG/100ML IV EMUL
INTRAVENOUS | Status: AC
  Filled 2022-02-12: qty 100

## 2022-02-12 MED ORDER — CEFAZOLIN SODIUM-DEXTROSE 2-4 GM/100ML-% IV SOLN
2.0000 g | INTRAVENOUS | Status: AC
  Administered 2022-02-12: 2 g via INTRAVENOUS

## 2022-02-12 MED ORDER — OXYCODONE-ACETAMINOPHEN 5-325 MG PO TABS
1.0000 | ORAL_TABLET | ORAL | 0 refills | Status: AC | PRN
Start: 2022-02-12 — End: 2022-02-17

## 2022-02-12 MED ORDER — SUCCINYLCHOLINE CHLORIDE 200 MG/10ML IV SOSY
PREFILLED_SYRINGE | INTRAVENOUS | Status: DC | PRN
  Administered 2022-02-12: 120 mg via INTRAVENOUS

## 2022-02-12 MED ORDER — PROPOFOL 10 MG/ML IV BOLUS
INTRAVENOUS | Status: DC | PRN
  Administered 2022-02-12: 100 mg via INTRAVENOUS
  Administered 2022-02-12: 200 mg via INTRAVENOUS

## 2022-02-12 MED ORDER — OXYCODONE-ACETAMINOPHEN 5-325 MG PO TABS
ORAL_TABLET | ORAL | Status: AC
  Filled 2022-02-12: qty 1

## 2022-02-12 MED ORDER — FENTANYL CITRATE (PF) 100 MCG/2ML IJ SOLN
INTRAMUSCULAR | Status: AC
  Filled 2022-02-12: qty 2

## 2022-02-12 MED ORDER — FENTANYL CITRATE (PF) 100 MCG/2ML IJ SOLN
INTRAMUSCULAR | Status: DC | PRN
  Administered 2022-02-12 (×4): 50 ug via INTRAVENOUS

## 2022-02-12 MED ORDER — CHLORHEXIDINE GLUCONATE 0.12 % MT SOLN
OROMUCOSAL | Status: AC
  Administered 2022-02-12: 15 mL via OROMUCOSAL
  Filled 2022-02-12: qty 15

## 2022-02-12 MED ORDER — DEXAMETHASONE SODIUM PHOSPHATE 10 MG/ML IJ SOLN
INTRAMUSCULAR | Status: AC
  Filled 2022-02-12: qty 1

## 2022-02-12 MED ORDER — CEFAZOLIN SODIUM-DEXTROSE 2-4 GM/100ML-% IV SOLN
INTRAVENOUS | Status: AC
  Filled 2022-02-12: qty 100

## 2022-02-12 MED ORDER — ONDANSETRON HCL 4 MG/2ML IJ SOLN
INTRAMUSCULAR | Status: DC | PRN
  Administered 2022-02-12: 4 mg via INTRAVENOUS

## 2022-02-12 MED ORDER — METHOCARBAMOL 500 MG PO TABS: 500.0000 mg | ORAL_TABLET | Freq: Four times a day (QID) | ORAL | 0 refills | Status: DC

## 2022-02-12 MED ORDER — PROPOFOL 10 MG/ML IV BOLUS
INTRAVENOUS | Status: AC
  Filled 2022-02-12: qty 20

## 2022-02-12 MED ORDER — REMIFENTANIL HCL 1 MG IV SOLR
INTRAVENOUS | Status: AC
  Filled 2022-02-12: qty 1000

## 2022-02-12 MED ORDER — OXYCODONE HCL 5 MG PO TABS
ORAL_TABLET | ORAL | Status: AC
  Filled 2022-02-12: qty 1

## 2022-02-12 MED ORDER — MIDAZOLAM HCL 5 MG/5ML IJ SOLN
INTRAMUSCULAR | Status: DC | PRN
  Administered 2022-02-12: 2 mg via INTRAVENOUS

## 2022-02-12 MED ORDER — PHENYLEPHRINE 40 MCG/ML (10ML) SYRINGE FOR IV PUSH (FOR BLOOD PRESSURE SUPPORT)
PREFILLED_SYRINGE | INTRAVENOUS | Status: DC | PRN
Start: 2022-02-12 — End: 2022-02-12
  Administered 2022-02-12: 160 ug via INTRAVENOUS
  Administered 2022-02-12: 80 ug via INTRAVENOUS

## 2022-02-12 MED ORDER — SENNA 8.6 MG PO TABS: 1.0000 | ORAL_TABLET | Freq: Every day | ORAL | 0 refills | Status: DC | PRN

## 2022-02-12 MED ORDER — ONDANSETRON HCL 4 MG/2ML IJ SOLN
INTRAMUSCULAR | Status: AC
  Filled 2022-02-12: qty 2

## 2022-02-12 MED ORDER — HYDROMORPHONE HCL 1 MG/ML IJ SOLN
INTRAMUSCULAR | Status: AC
  Filled 2022-02-12: qty 1

## 2022-02-12 MED ORDER — ORAL CARE MOUTH RINSE: 15.0000 mL | Freq: Once | OROMUCOSAL | Status: AC

## 2022-02-12 MED ORDER — SODIUM CHLORIDE 0.9 % IV SOLN: INTRAVENOUS | Status: DC

## 2022-02-12 MED ORDER — FENTANYL CITRATE (PF) 100 MCG/2ML IJ SOLN
25.0000 ug | INTRAMUSCULAR | Status: AC | PRN
  Administered 2022-02-12 (×7): 25 ug via INTRAVENOUS

## 2022-02-12 MED ORDER — CELECOXIB 100 MG PO CAPS: 100.0000 mg | ORAL_CAPSULE | Freq: Two times a day (BID) | ORAL | 0 refills | Status: DC | PRN

## 2022-02-12 MED ORDER — OXYCODONE HCL 5 MG PO TABS
5.0000 mg | ORAL_TABLET | ORAL | Status: DC | PRN
  Administered 2022-02-12: 5 mg via ORAL

## 2022-02-12 MED ORDER — METHOCARBAMOL 500 MG PO TABS
ORAL_TABLET | ORAL | Status: AC
  Filled 2022-02-12: qty 1

## 2022-02-12 MED ORDER — HYDROMORPHONE HCL 1 MG/ML IJ SOLN
INTRAMUSCULAR | Status: DC | PRN
  Administered 2022-02-12 (×2): .5 mg via INTRAVENOUS

## 2022-02-12 MED ORDER — CHLORHEXIDINE GLUCONATE 0.12 % MT SOLN: 15.0000 mL | Freq: Once | OROMUCOSAL | Status: AC

## 2022-02-12 MED ORDER — FENTANYL CITRATE (PF) 100 MCG/2ML IJ SOLN
INTRAMUSCULAR | Status: AC
  Administered 2022-02-12: 25 ug via INTRAVENOUS
  Filled 2022-02-12: qty 2

## 2022-02-12 MED ORDER — ONDANSETRON HCL 4 MG/2ML IJ SOLN: 4.0000 mg | Freq: Once | INTRAMUSCULAR | Status: DC | PRN

## 2022-02-12 MED ORDER — ACETAMINOPHEN 10 MG/ML IV SOLN
INTRAVENOUS | Status: DC | PRN
Start: 2022-02-12 — End: 2022-02-12
  Administered 2022-02-12: 1000 mg via INTRAVENOUS

## 2022-02-12 MED ORDER — BUPIVACAINE-EPINEPHRINE (PF) 0.5% -1:200000 IJ SOLN
INTRAMUSCULAR | Status: DC | PRN
  Administered 2022-02-12: 5 mL

## 2022-02-12 MED ORDER — BUPIVACAINE-EPINEPHRINE (PF) 0.5% -1:200000 IJ SOLN
INTRAMUSCULAR | Status: AC
  Filled 2022-02-12: qty 30

## 2022-02-12 MED ORDER — OXYCODONE-ACETAMINOPHEN 5-325 MG PO TABS
1.0000 | ORAL_TABLET | Freq: Once | ORAL | Status: AC
  Administered 2022-02-12: 1 via ORAL

## 2022-02-12 MED ORDER — LIDOCAINE HCL (PF) 2 % IJ SOLN
INTRAMUSCULAR | Status: AC
  Filled 2022-02-12: qty 5

## 2022-02-12 MED ORDER — METHOCARBAMOL 500 MG PO TABS
500.0000 mg | ORAL_TABLET | Freq: Once | ORAL | Status: AC
  Administered 2022-02-12: 500 mg via ORAL

## 2022-02-12 MED ORDER — ACETAMINOPHEN 10 MG/ML IV SOLN
INTRAVENOUS | Status: AC
  Filled 2022-02-12: qty 100

## 2022-02-12 MED ORDER — PROPOFOL 500 MG/50ML IV EMUL
INTRAVENOUS | Status: DC | PRN
Start: 2022-02-12 — End: 2022-02-12
  Administered 2022-02-12: 200 ug/kg/min via INTRAVENOUS

## 2022-02-12 MED ORDER — MIDAZOLAM HCL 2 MG/2ML IJ SOLN
INTRAMUSCULAR | Status: AC
  Filled 2022-02-12: qty 2

## 2022-02-12 MED ORDER — KETAMINE HCL 10 MG/ML IJ SOLN
INTRAMUSCULAR | Status: DC | PRN
  Administered 2022-02-12: 20 mg via INTRAVENOUS

## 2022-02-12 MED ORDER — DEXAMETHASONE SODIUM PHOSPHATE 10 MG/ML IJ SOLN
INTRAMUSCULAR | Status: DC | PRN
  Administered 2022-02-12: 10 mg via INTRAVENOUS

## 2022-02-12 MED ORDER — PHENYLEPHRINE HCL-NACL 20-0.9 MG/250ML-% IV SOLN
INTRAVENOUS | Status: DC | PRN
  Administered 2022-02-12: 35 ug/min via INTRAVENOUS

## 2022-02-12 MED ORDER — GLYCOPYRROLATE 0.2 MG/ML IJ SOLN
INTRAMUSCULAR | Status: DC | PRN
Start: 2022-02-12 — End: 2022-02-12
  Administered 2022-02-12: .2 mg via INTRAVENOUS

## 2022-02-12 MED ORDER — SURGIFLO WITH THROMBIN (HEMOSTATIC MATRIX KIT) OPTIME
TOPICAL | Status: DC | PRN
  Administered 2022-02-12: 1 via TOPICAL

## 2022-02-12 MED ORDER — 0.9 % SODIUM CHLORIDE (POUR BTL) OPTIME
TOPICAL | Status: DC | PRN
  Administered 2022-02-12: 100 mL

## 2022-02-12 MED ORDER — KETAMINE HCL 50 MG/5ML IJ SOSY
PREFILLED_SYRINGE | INTRAMUSCULAR | Status: AC
  Filled 2022-02-12: qty 5

## 2022-02-12 MED ORDER — REMIFENTANIL HCL 1 MG IV SOLR
INTRAVENOUS | Status: DC | PRN
  Administered 2022-02-12: .05 ug/kg/min via INTRAVENOUS

## 2022-02-12 SURGICAL SUPPLY — 64 items
BASKET BONE COLLECTION (BASKET) IMPLANT
BLADE BOVIE TIP EXT 4 (BLADE) ×1 IMPLANT
BULB RESERV EVAC DRAIN JP 100C (MISCELLANEOUS) IMPLANT
BUR NEURO DRILL SOFT 3.0X3.8M (BURR) ×2 IMPLANT
CHLORAPREP W/TINT 26 (MISCELLANEOUS) ×4 IMPLANT
COUNTER NEEDLE 20/40 LG (NEEDLE) ×2 IMPLANT
CUP MEDICINE 2OZ PLAST GRAD ST (MISCELLANEOUS) ×2 IMPLANT
DERMABOND ADVANCED (GAUZE/BANDAGES/DRESSINGS) ×1
DERMABOND ADVANCED .7 DNX12 (GAUZE/BANDAGES/DRESSINGS) ×1 IMPLANT
DRAIN CHANNEL JP 10F RND 20C F (MISCELLANEOUS) IMPLANT
DRAPE C ARM PK CFD 31 SPINE (DRAPES) ×2 IMPLANT
DRAPE LAPAROTOMY 77X122 PED (DRAPES) ×2 IMPLANT
DRAPE MICROSCOPE SPINE 48X150 (DRAPES) ×2 IMPLANT
DRAPE SURG 17X11 SM STRL (DRAPES) ×8 IMPLANT
DRSG TEGADERM 8X12 (GAUZE/BANDAGES/DRESSINGS) ×1 IMPLANT
ELECT CAUTERY BLADE TIP 2.5 (TIP) ×2
ELECT REM PT RETURN 9FT ADLT (ELECTROSURGICAL) ×2
ELECTRODE CAUTERY BLDE TIP 2.5 (TIP) ×1 IMPLANT
ELECTRODE REM PT RTRN 9FT ADLT (ELECTROSURGICAL) ×1 IMPLANT
FEE INTRAOP CADWELL SUPPLY NCS (MISCELLANEOUS) ×1 IMPLANT
FEE INTRAOP MONITOR IMPULS NCS (MISCELLANEOUS) IMPLANT
GAUZE 4X4 16PLY ~~LOC~~+RFID DBL (SPONGE) ×2 IMPLANT
GLOVE SURG SYN 6.5 ES PF (GLOVE) ×4 IMPLANT
GLOVE SURG SYN 6.5 PF PI (GLOVE) ×2 IMPLANT
GLOVE SURG SYN 8.5  E (GLOVE) ×3
GLOVE SURG SYN 8.5 E (GLOVE) ×3 IMPLANT
GLOVE SURG SYN 8.5 PF PI (GLOVE) ×3 IMPLANT
GLOVE SURG UNDER POLY LF SZ6.5 (GLOVE) ×4 IMPLANT
GOWN SRG LRG LVL 4 IMPRV REINF (GOWNS) ×2 IMPLANT
GOWN SRG XL LVL 3 NONREINFORCE (GOWNS) ×1 IMPLANT
GOWN STRL NON-REIN TWL XL LVL3 (GOWNS) ×1
GOWN STRL REIN LRG LVL4 (GOWNS) ×2
GRADUATE 1200CC STRL 31836 (MISCELLANEOUS) ×2 IMPLANT
INTRAOP CADWELL SUPPLY FEE NCS (MISCELLANEOUS) ×1
INTRAOP DISP SUPPLY FEE NCS (MISCELLANEOUS) ×1
INTRAOP MONITOR FEE IMPULS NCS (MISCELLANEOUS)
INTRAOP MONITOR FEE IMPULSE (MISCELLANEOUS)
KIT TURNOVER KIT A (KITS) ×2 IMPLANT
MANIFOLD NEPTUNE II (INSTRUMENTS) ×2 IMPLANT
MARKER SKIN DUAL TIP RULER LAB (MISCELLANEOUS) ×4 IMPLANT
NDL SAFETY ECLIPSE 18X1.5 (NEEDLE) ×1 IMPLANT
NEEDLE HYPO 18GX1.5 SHARP (NEEDLE) ×1
NEEDLE HYPO 22GX1.5 SAFETY (NEEDLE) ×2 IMPLANT
NS IRRIG 1000ML POUR BTL (IV SOLUTION) ×2 IMPLANT
PACK LAMINECTOMY NEURO (CUSTOM PROCEDURE TRAY) ×2 IMPLANT
PAD ARMBOARD 7.5X6 YLW CONV (MISCELLANEOUS) ×4 IMPLANT
PIN CASPAR 14 (PIN) ×1 IMPLANT
PIN CASPAR 14MM (PIN) ×2 IMPLANT
PLATE ANT CERV XTEND 2 LV 32 (Plate) ×1 IMPLANT
PUTTY DBX 2.5CC (Putty) ×2 IMPLANT
PUTTY DBX 2.5CC DEPUY (Putty) IMPLANT
SCREW VAR 4.2 XD SELF DRILL 16 (Screw) ×6 IMPLANT
SPACER HEDRON C 12X14X8 7D (Spacer) ×2 IMPLANT
SPONGE KITTNER 5P (MISCELLANEOUS) ×2 IMPLANT
STAPLER SKIN PROX 35W (STAPLE) IMPLANT
SURGIFLO W/THROMBIN 8M KIT (HEMOSTASIS) ×2 IMPLANT
SUT V-LOC 90 ABS DVC 3-0 CL (SUTURE) ×2 IMPLANT
SUT VIC AB 3-0 SH 8-18 (SUTURE) ×2 IMPLANT
SYR 30ML LL (SYRINGE) ×2 IMPLANT
TAPE CLOTH 3X10 WHT NS LF (GAUZE/BANDAGES/DRESSINGS) ×2 IMPLANT
TOWEL OR 17X26 4PK STRL BLUE (TOWEL DISPOSABLE) ×6 IMPLANT
TRAY FOLEY MTR SLVR 16FR STAT (SET/KITS/TRAYS/PACK) IMPLANT
TUBING CONNECTING 10 (TUBING) ×2 IMPLANT
WATER STERILE IRR 500ML POUR (IV SOLUTION) ×2 IMPLANT

## 2022-02-12 NOTE — Transfer of Care (Signed)
Immediate Anesthesia Transfer of Care Note ? ?Patient: Luis Hatfield. ? ?Procedure(s) Performed: C5-7 ANTERIOR CERVICAL DISCECTOMY & FUSION (GLOBUS HEDRON) ? ?Patient Location: PACU ? ?Anesthesia Type:General ? ?Level of Consciousness: awake ? ?Airway & Oxygen Therapy: Patient Spontanous Breathing and Patient connected to face mask oxygen ? ?Post-op Assessment: Report given to RN and Post -op Vital signs reviewed and stable ? ?Post vital signs: Reviewed and stable ? ?Last Vitals:  ?Vitals Value Taken Time  ?BP 136/77 02/12/22 1226  ?Temp    ?Pulse 89 02/12/22 1230  ?Resp 14 02/12/22 1230  ?SpO2 98 % 02/12/22 1230  ?Vitals shown include unvalidated device data. ? ?Last Pain:  ?Vitals:  ? 02/12/22 0815  ?TempSrc: Oral  ?PainSc: 5   ?   ? ?  ? ?Complications: No notable events documented. ?

## 2022-02-12 NOTE — Discharge Instructions (Addendum)
?Your surgeon has performed an operation on your cervical spine (neck) to relieve pressure on the spinal cord and/or nerves. This involved making an incision in the front of your neck and removing one or more of the discs that support your spine. Next, a small piece of bone, a titanium plate, and screws were used to fuse two or more of the vertebrae (bones) together. ? ?The following are instructions to help in your recovery once you have been discharged from the hospital. Even if you feel well, it is important that you follow these activity guidelines. If you do not let your neck heal properly from the surgery, you can increase the chance of return of your symptoms and other complications. ? ?* Do not take anti-inflammatory medications for 3 months after surgery (naproxen [Aleve], ibuprofen [Advil, Motrin], ). These medications can prevent your bones from healing properly. ? ?Activity  ?  ?No bending, lifting, or twisting (?BLT?). Avoid lifting objects heavier than 10 pounds (gallon milk jug).  Where possible, avoid household activities that involve lifting, bending, reaching, pushing, or pulling such as laundry, vacuuming, grocery shopping, and childcare. Try to arrange for help from friends and family for these activities while your back heals. ? ?Increase physical activity slowly as tolerated.  Taking short walks is encouraged, but avoid strenuous exercise. Do not jog, run, bicycle, lift weights, or participate in any other exercises unless specifically allowed by your doctor. ? ?Talk to your doctor before resuming sexual activity. ? ?You should not drive until cleared by your doctor. ? ?Until released by your doctor, you should not return to work or school.  You should rest at home and let your body heal.  ? ?You may shower three days after your surgery.  After showering, lightly dab your incision dry. Do not take a tub bath or go swimming until approved by your doctor at your follow-up appointment. ? ?If your  doctor ordered a cervical collar (neck brace) for you, you should wear it whenever you are out of bed. You may remove it when lying down or sleeping, but you should wear it at all other times. Not all neck surgeries require a cervical collar. ? ?If you smoke, we strongly recommend that you quit.  Smoking has been proven to interfere with normal bone healing and will dramatically reduce the success rate of your surgery. Please contact [facility] (800-QUIT-NOW) and use the resources at www.[facility].com for assistance in stopping smoking. ? ?Surgical Incision ?  ? Keep your incision area clean and dry. ? ?Your incision was closed with Dermabond glue. The glue should begin to peel away within about a week. ?Diet          ? ?You may return to your usual diet. However, you may experience discomfort when swallowing in the first month after your surgery. This is normal. You may find that softer foods are more comfortable for you to swallow. Be sure to stay hydrated. ? ?When to Contact us ? ?You may experience pain in your neck and/or pain between your shoulder blades. This is normal and should improve in the next few weeks with the help of pain medication, muscle relaxers, and rest. Some patients report that a warm compress on the back of the neck or between the shoulder blades helps. ? ?However, should you experience any of the following, contact us immediately: ?New numbness or weakness ?Pain that is progressively getting worse, and is not relieved by your pain medication, muscle relaxers, rest, and warm compresses ?  Bleeding, redness, swelling, pain, or drainage from surgical incision ?Chills or flu-like symptoms ?Fever greater than 101.0 F (38.3 C) ?Inability to eat, drink fluids, or take medications ?Problems with bowel or bladder functions ?Difficulty breathing or shortness of breath ?Warmth, tenderness, or swelling in your calf ?Contact Information ?During office hours (Monday-Friday 9 am to 5 pm), please call your  physician at [phone] and ask for Berdine Addison ?After hours and weekends, please call [phone] and speak with the answering service, who will contact the doctor on call.  If that fails, call the [facility] Operator at [phone] and ask for the Neurosurgery Resident On Call  ?For a life-threatening emergency, call 911 ? AMBULATORY SURGERY  ?DISCHARGE INSTRUCTIONS ? ? ?The drugs that you were given will stay in your system until tomorrow so for the next 24 hours you should not: ? ?Drive an automobile ?Make any legal decisions ?Drink any alcoholic beverage ? ? ?You may resume regular meals tomorrow.  Today it is better to start with liquids and gradually work up to solid foods. ? ?You may eat anything you prefer, but it is better to start with liquids, then soup and crackers, and gradually work up to solid foods. ? ? ?Please notify your doctor immediately if you have any unusual bleeding, trouble breathing, redness and pain at the surgery site, drainage, fever, or pain not relieved by medication. ? ? ? ?Additional Instructions: ? ? ? ? ? ? ? ?Please contact your physician with any problems or Same Day Surgery at [phone], Monday through Friday 6 am to 4 pm, or [place] at [facility] number at [phone]. AMBULATORY SURGERY  ?DISCHARGE INSTRUCTIONS ? ? ?The drugs that you were given will stay in your system until tomorrow so for the next 24 hours you should not: ? ?Drive an automobile ?Make any legal decisions ?Drink any alcoholic beverage ? ? ?You may resume regular meals tomorrow.  Today it is better to start with liquids and gradually work up to solid foods. ? ?You may eat anything you prefer, but it is better to start with liquids, then soup and crackers, and gradually work up to solid foods. ? ? ?Please notify your doctor immediately if you have any unusual bleeding, trouble breathing, redness and pain at the surgery site, drainage, fever, or pain not relieved by medication. ? ? ? ?Additional  Instructions: ? ? ? ? ? ? ? ?Please contact your physician with any problems or Same Day Surgery at [phone], Monday through Friday 6 am to 4 pm, or [place] at [facility] number at [phone].  ?

## 2022-02-12 NOTE — Discharge Summary (Signed)
Physician Discharge Summary  ?Patient ID: ?Luis Hatfield. ?MRN: [id number] ?DOB/AGE: 1966/05/04 56 y.o. ? ?Admit date: 02/12/2022 ?Discharge date: 02/12/2022 ? ?Admission Diagnoses: cervical myelopathy ? ?Discharge Diagnoses:  ?Active Problems: ?  * No active hospital problems. * ? ? ?Discharged Condition: good ? ?Hospital Course:  ?Luis Hatfield is a 56 y.o s/p C5-7 ACDF. His interoperative course was uncomplicated and he was monitored in PACU post-operatively for four hours. He was discharged home after ambulating, urinating, and tolerating PO intake.  ? ?Consults:  none  ? ?Significant Diagnostic Studies: none  ? ?Treatments: surgery: as above. Please see separately dictated operative report for further details. ? ?Discharge Exam: ?Blood pressure 133/83, pulse 78, temperature 98.3 ?F (36.8 ?C), temperature source Oral, resp. rate 17, height 5\' 11"  (1.803 m), weight 108 kg, SpO2 98 %. ?CN II-XII grossly intact  ?5/5 throughout RUE  ?LUE 5/5 deltoid, bicep. 4 tricep, IO, HG ? ?Disposition: Discharge disposition: 01-Home or Self Care ? ? ? ? ? ? ? ?Allergies as of 02/12/2022   ?No Known Allergies ?  ? ?  ?Medication List  ?  ? ?TAKE these medications   ? ?albuterol 108 (90 Base) MCG/ACT inhaler ?Commonly known as: VENTOLIN HFA ?Inhale 2 puffs into the lungs every 6 (six) hours as needed for wheezing or shortness of breath. ?  ?atorvastatin 80 MG tablet ?Commonly known as: LIPITOR ?Take 80 mg by mouth at bedtime. ?  ?celecoxib 100 MG capsule ?Commonly known as: CeleBREX ?Take 1 capsule (100 mg total) by mouth 2 (two) times daily as needed for moderate pain. ?  ?cetirizine 10 MG tablet ?Commonly known as: ZYRTEC ?Take 10 mg by mouth daily. ?  ?diclofenac Sodium 1 % Gel ?Commonly known as: VOLTAREN ?2 g daily as needed (joint pain). ?  ?empagliflozin 25 MG Tabs tablet ?Commonly known as: JARDIANCE ?Take 25 mg by mouth daily. ?  ?gabapentin 100 MG capsule ?Commonly known as: NEURONTIN ?Take 200 mg by mouth 2  (two) times daily. ?  ?LACTASE-LACTOBACILLUS PO ?Take 1 capsule by mouth daily. ?  ?Lantus 100 UNIT/ML injection ?Generic drug: insulin glargine ?50 Units at bedtime. ?  ?metFORMIN 1000 MG tablet ?Commonly known as: GLUCOPHAGE ?Take 1,000 mg by mouth 2 (two) times daily. ?  ?methocarbamol 500 MG tablet ?Commonly known as: Robaxin ?Take 1 tablet (500 mg total) by mouth 4 (four) times daily. ?  ?nortriptyline 10 MG capsule ?Commonly known as: PAMELOR ?Take 10 mg by mouth daily. ?  ?nortriptyline 25 MG capsule ?Commonly known as: PAMELOR ?Take 50 mg by mouth at bedtime. ?  ?omeprazole 40 MG capsule ?Commonly known as: PRILOSEC ?40 mg 2 (two) times daily before a meal. ?  ?oxyCODONE-acetaminophen 5-325 MG tablet ?Commonly known as: Percocet ?Take 1 tablet by mouth every 4 (four) hours as needed for up to 5 days for severe pain. ?  ?prazosin 1 MG capsule ?Commonly known as: MINIPRESS ?Take 1 mg by mouth at bedtime. ?  ?QUEtiapine 25 MG tablet ?Commonly known as: SEROQUEL ?Take 50 mg by mouth at bedtime. ?  ?senna 8.6 MG Tabs tablet ?Commonly known as: SENOKOT ?Take 1 tablet (8.6 mg total) by mouth daily as needed for mild constipation. ?  ?sertraline 100 MG tablet ?Commonly known as: ZOLOFT ?Take 100 mg by mouth 2 (two) times daily. ?  ?sildenafil 50 MG tablet ?Commonly known as: VIAGRA ?Take 50 mg by mouth daily as needed for erectile dysfunction. ?  ? ?  ? ? Follow-up Information   ? ?  Luis Dicker, PA Follow up in 2 week(s).   ?Why: for post-op and incision check ?Contact information: ?[place] [address] ?[phone] ? ? ?  ?  ? ?  ?  ? ?  ? ? ?Signed: ?Luis Hatfield ?02/12/2022, 12:22 PM ? ? ?

## 2022-02-12 NOTE — H&P (Signed)
I have reviewed and confirmed my history and physical from 01/21/22 with no additions or changes. Plan for C5-7 ACDF.  Risks and benefits reviewed. ? ?Heart sounds normal no MRG. Chest Clear to Auscultation Bilaterally. ? ? ?  ? ?

## 2022-02-12 NOTE — Anesthesia Preprocedure Evaluation (Addendum)
Anesthesia Evaluation  ?Patient identified by MRN, date of birth, ID band ?Patient awake ? ? ? ?Reviewed: ?Allergy & Precautions, NPO status , Patient's Chart, lab work & pertinent test results ? ?Airway ?Mallampati: II ? ?TM Distance: >3 FB ?Neck ROM: Full ? ? ? Dental ? ?(+) Teeth Intact ?  ?Pulmonary ?neg pulmonary ROS, sleep apnea and Continuous Positive Airway Pressure Ventilation , COPD,  ?  ?Pulmonary exam normal ? ?+ decreased breath sounds ? ? ? ? ? Cardiovascular ?Exercise Tolerance: Good ?negative cardio ROS ?Normal cardiovascular exam ?Rhythm:Regular Rate:Normal ? ? ?  ?Neuro/Psych ?Anxiety Depression negative neurological ROS ? negative psych ROS  ? GI/Hepatic ?negative GI ROS, Neg liver ROS, GERD  ,  ?Endo/Other  ?negative endocrine ROSdiabetes, Well Controlled, Type 2, Oral Hypoglycemic Agents ? Renal/GU ?negative Renal ROS  ? ?  ?Musculoskeletal ? ?(+) Arthritis ,  ? Abdominal ?(+) + obese,   ?Peds ?negative pediatric ROS ?(+)  Hematology ?negative hematology ROS ?(+)   ?Anesthesia Other Findings ?Past Medical History: ?No date: Anxiety ?No date: Cancer [facility]) ?    Comment:  skin cancer on back ?No date: COPD (chronic obstructive pulmonary disease) ([place]) ?No date: Depression ?No date: Diabetes mellitus without complication ([place]) ?No date: GERD (gastroesophageal reflux disease) ?No date: Hyperlipidemia ?No date: Osteoarthritis ?No date: Sleep apnea ? ?Past Surgical History: ?2007: CHOLECYSTECTOMY ?2017: COLONOSCOPY ?1977: TONSILLECTOMY ? ?BMI   ? Body Mass Index: 33.19 kg/m?  ?  ? ? Reproductive/Obstetrics ?negative OB ROS ? ?  ? ? ? ? ? ? ? ? ? ? ? ? ? ?  ?  ? ? ? ? ? ? ? ?Anesthesia Physical ?Anesthesia Plan ? ?ASA: 3 ? ?Anesthesia Plan: General  ? ?Post-op Pain Management:   ? ?Induction: Intravenous ? ?PONV Risk Score and Plan: Ondansetron, Dexamethasone, Midazolam and Treatment may vary due to age or medical condition ? ?Airway Management Planned: Oral  ETT ? ?Additional Equipment:  ? ?Intra-op Plan:  ? ?Post-operative Plan: Extubation in OR ? ?Informed Consent: I have reviewed the patients History and Physical, chart, labs and discussed the procedure including the risks, benefits and alternatives for the proposed anesthesia with the patient or authorized representative who has indicated his/her understanding and acceptance.  ? ? ? ?Dental Advisory Given ? ?Plan Discussed with: CRNA and Surgeon ? ?Anesthesia Plan Comments:   ? ? ? ? ? ? ?Anesthesia Quick Evaluation ? ?

## 2022-02-12 NOTE — Anesthesia Postprocedure Evaluation (Signed)
Anesthesia Post Note ? ?Patient: Luis Hatfield. ? ?Procedure(s) Performed: C5-7 ANTERIOR CERVICAL DISCECTOMY & FUSION (GLOBUS HEDRON) ? ?Patient location during evaluation: PACU ?Anesthesia Type: General ?Level of consciousness: awake and awake and alert ?Pain management: pain level controlled ?Vital Signs Assessment: post-procedure vital signs reviewed and stable ?Respiratory status: spontaneous breathing and respiratory function stable ?Cardiovascular status: stable ?Anesthetic complications: no ? ? ?No notable events documented. ? ? ?Last Vitals:  ?Vitals:  ? 02/12/22 1316 02/12/22 1330  ?BP:  129/77  ?Pulse: 81 76  ?Resp: 11 18  ?Temp:    ?SpO2: 96% 97%  ?  ?Last Pain:  ?Vitals:  ? 02/12/22 1330  ?TempSrc:   ?PainSc: 7   ? ? ?  ?  ?  ?  ?  ?  ? ?VAN STAVEREN,Audie Stayer ? ? ? ? ?

## 2022-02-12 NOTE — Progress Notes (Signed)
Pharmacy Antibiotic Note ? ?Luis Hatfield. is a 56 y.o. male admitted on (Not on file) with surgical prophylaxis.  Pharmacy has been consulted for Cefazolin dosing. ? ?Plan: ?TBW = 109.8 kg  ? ?Cefazolin 2 gm IV X 1 60 min pre-op ordered for 03/03 @ 0500.  ? ?  ? ?No data recorded. ? ?No results for input(s): WBC, CREATININE, LATICACIDVEN, VANCOTROUGH, VANCOPEAK, VANCORANDOM, GENTTROUGH, GENTPEAK, GENTRANDOM, TOBRATROUGH, TOBRAPEAK, TOBRARND, AMIKACINPEAK, AMIKACINTROU, AMIKACIN in the last 168 hours.  ?Estimated Creatinine Clearance: 120.9 mL/min (by C-G formula based on SCr of 0.86 mg/dL).   ? ?No Known Allergies ? ?Antimicrobials this admission: ?  >>  ?  >>  ? ?Dose adjustments this admission: ? ? ?Microbiology results: ? BCx:  ? UCx:   ? Sputum:   ? MRSA PCR:  ? ?Thank you for allowing pharmacy to be a part of this patient?s care. ? ?Cynthis Purington D ?02/12/2022 12:42 AM ? ?

## 2022-02-12 NOTE — Op Note (Signed)
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  He had worsening symptoms prompting surgical intervention. ? ?Findings: cervical stenosis ? 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 ? ?EBL: 20 ml ?IVF: see AR ml ?Drains: none ?Disposition: Extubated and Stable to PACU ?Complications: none ? ?No foley catheter was placed. ? ? ?Preoperative Note:  ? ?Risks of surgery discussed include: infection, bleeding, stroke, coma, death, paralysis, CSF leak, nerve/spinal cord injury, numbness, tingling, weakness, complex regional pain syndrome, recurrent stenosis and/or disc herniation, vascular injury, development of instability, neck/back pain, need for further surgery, persistent symptoms, development of deformity, and the risks of anesthesia. The patient understood these risks and agreed to proceed. ? ?Operative Note:  ? ?Procedure:  ?1) Anterior cervical diskectomy and fusion at C5/6 and C6/7 ?2) Anterior cervical instrumentation at C5 - 7 using Globus Xtend ?3) Placement of biomechanical devices at C5/6 and C6/7  ?4) Use of operative microscope ?5) Use of flouroscopy ?  ?Procedure: After obtaining informed consent, the patient taken to the operating room, placed in supine position, general anesthesia induced.  The patient had a small shoulder roll placed behind their shoulders.  The patient received preop antibiotics and IV Decadron.  The patient had a neck incision outlined, was prepped and draped in usual sterile fashion. The incision was injected with local anesthetic.   An incision was opened, dissection taken down medial to the carotid artery and jugular vein, lateral to the trachea and esophagus.  The prevertebral fascia identified and a localizing x-ray demonstrated the correct level.  The longus colli were dissected laterally, and self-retaining retractors placed to open the operative field. The microscope was then brought into  the field. ? ?With this complete, distractor pins were placed in the vertebral bodies of C5 and C7. The distractor was placed, and the annuli at C5/6 and C6/7 were opened using a bovie.  Curettes and pituitary rongeurs used to remove the majority of disk, then the drill was used to remove the posterior osteophyte and begin the foraminotomies. The nerve hook was used to elevate the posterior longitudinal ligament, which was then removed with Kerrison rongeurs. The microblunt nerve hook could be passed out the foramina bilaterally at each level.   Meticulous hemostasis was obtained.  A biomechanical device (Globus Hedron 8 mm height x 14 mm width by 12 mm depth) was placed at C5/6. A second biomechanical device (Globus Hedron 8 mm height x 14 mm width by 12 mm depth) was placed at C6/7. Each device had been filled with allograft for aid in arthrodesis. ? ?The caspar distractor was removed, and bone wax used for hemostasis. A 32 mm Globus Xtend plate was chosen.  Two screws placed in each vertebral body, respectively making sure the screws were behind the locking mechanism.  Final AP and lateral radiographs were taken.  ? ?With everything in good position, the wound was irrigated copiously with bacitracin-containing solution and meticulous hemostasis obtained.  Wound was closed in 2 layers using interrupted inverted 3-0 Vicryl sutures in the platysma and 3-0 monocryl on the dermis.  The wound was dressed with dermabond, the head of bed at 30 degrees, taken to recovery room in stable condition.  No new postop neurological deficits were identified. ? ?Sponge and pattie counts were correct at the end of the procedure.  ? ?Monitoring was stable throughout. ? ? ?I performed the entire procedure with the assistance of Cooper Render PA as an Pensions consultant. ? ?  Meade Maw MD ? ?  ?

## 2022-02-15 ENCOUNTER — Encounter: Payer: Self-pay | Admitting: Neurosurgery

## 2022-08-19 ENCOUNTER — Other Ambulatory Visit: Payer: Self-pay | Admitting: Student

## 2022-08-19 DIAGNOSIS — H538 Other visual disturbances: Secondary | ICD-10-CM

## 2022-08-19 DIAGNOSIS — R519 Headache, unspecified: Secondary | ICD-10-CM

## 2022-08-19 DIAGNOSIS — F4489 Other dissociative and conversion disorders: Secondary | ICD-10-CM

## 2022-09-06 ENCOUNTER — Ambulatory Visit
Admission: RE | Admit: 2022-09-06 | Discharge: 2022-09-06 | Disposition: A | Discharge status: Home / Self Care | Payer: No Typology Code available for payment source | Source: Ambulatory Visit | Attending: Student | Admitting: Student

## 2022-09-06 DIAGNOSIS — H538 Other visual disturbances: Secondary | ICD-10-CM | POA: Diagnosis present

## 2022-09-06 DIAGNOSIS — F4489 Other dissociative and conversion disorders: Secondary | ICD-10-CM | POA: Insufficient documentation

## 2022-09-06 DIAGNOSIS — R519 Headache, unspecified: Secondary | ICD-10-CM | POA: Insufficient documentation

## 2022-09-17 ENCOUNTER — Ambulatory Visit (HOSPITAL_COMMUNITY): Payer: Non-veteran care

## 2022-11-09 ENCOUNTER — Ambulatory Visit: Payer: Non-veteran care | Admitting: Neurosurgery

## 2023-01-05 ENCOUNTER — Other Ambulatory Visit: Payer: Self-pay

## 2023-01-05 DIAGNOSIS — Z981 Arthrodesis status: Secondary | ICD-10-CM

## 2023-01-06 ENCOUNTER — Ambulatory Visit
Admission: RE | Admit: 2023-01-06 | Discharge: 2023-01-06 | Disposition: A | Discharge status: Home / Self Care | Payer: No Typology Code available for payment source | Source: Ambulatory Visit | Attending: Neurosurgery | Admitting: Neurosurgery

## 2023-01-06 ENCOUNTER — Ambulatory Visit (INDEPENDENT_AMBULATORY_CARE_PROVIDER_SITE_OTHER): Payer: No Typology Code available for payment source | Admitting: Neurosurgery

## 2023-01-06 ENCOUNTER — Encounter: Payer: Self-pay | Admitting: Neurosurgery

## 2023-01-06 VITALS — BP 120/71 | HR 92 | Wt 234.6 lb

## 2023-01-06 DIAGNOSIS — M549 Dorsalgia, unspecified: Secondary | ICD-10-CM | POA: Diagnosis not present

## 2023-01-06 DIAGNOSIS — G8929 Other chronic pain: Secondary | ICD-10-CM | POA: Diagnosis not present

## 2023-01-06 DIAGNOSIS — Z981 Arthrodesis status: Secondary | ICD-10-CM

## 2023-01-06 NOTE — Progress Notes (Signed)
   DOS: 02/12/22  HISTORY OF PRESENT ILLNESS: 01/06/2023 Mr. Luis Hatfield is status post ACDF.  He is doing well from the surgery.  Unfortunately, he is having severe lower back pain.  He is awaiting an appointment in the [place].   PHYSICAL EXAMINATION:   Vitals:   01/06/23 1148  BP: 120/71  Pulse: 92   General: Patient is well developed, well nourished, calm, collected, and in no apparent distress.  NEUROLOGICAL:  General: In no acute distress.  Awake, alert, oriented to person, place, and time. Pupils equal round and reactive to light.   Strength: Side Biceps Triceps Deltoid Interossei Grip Wrist Ext. Wrist Flex.  R '5 5 5 5 5 5 5  '$ L '5 5 5 5 5 5 5   '$ Incision c/d/i   ROS (Neurologic): Negative except as noted above  IMAGING: No complications noted  ASSESSMENT/PLAN:  Luis Hatfield is doing well after anterior cervical discectomy and fusion.  He is having severe lower back pain.  He is awaiting an appointment in the [place] for evaluation of this.  He is having some discomfort around his left elbow.  This appears to be consistent with extensor tendinitis.  I encouraged him to take NSAIDs.  I will see him back on an as-needed basis.  I am happy to see him in the clinic here for his back if that is felt to be appropriate after he sees his new provider at the [place].  I spent a total of 10 minutes in this patient's care today. This time was spent reviewing pertinent records including imaging studies, obtaining and confirming history, performing a directed evaluation, formulating and discussing my recommendations, and documenting the visit within the medical record.    Meade Maw MD, [facility] Department of Neurosurgery

## 2023-03-03 ENCOUNTER — Telehealth: Payer: Self-pay

## 2023-03-03 ENCOUNTER — Inpatient Hospital Stay
Admission: RE | Admit: 2023-03-03 | Discharge: 2023-03-03 | Disposition: A | Discharge status: Home / Self Care | Payer: Self-pay | Source: Ambulatory Visit | Attending: Neurosurgery | Admitting: Neurosurgery

## 2023-03-03 ENCOUNTER — Other Ambulatory Visit: Payer: Self-pay

## 2023-03-03 DIAGNOSIS — Z049 Encounter for examination and observation for unspecified reason: Secondary | ICD-10-CM

## 2023-03-03 NOTE — Telephone Encounter (Signed)
Received CD from [facility] with MRI lumbar spine dated 01/20/23. Images have been loaded to [facility]. Report is scanned in media.

## 2023-03-10 ENCOUNTER — Encounter: Payer: Self-pay | Admitting: Neurosurgery

## 2023-03-10 ENCOUNTER — Ambulatory Visit (INDEPENDENT_AMBULATORY_CARE_PROVIDER_SITE_OTHER): Payer: No Typology Code available for payment source | Admitting: Neurosurgery

## 2023-03-10 VITALS — BP 132/72 | HR 97 | Ht 72.0 in | Wt 234.6 lb

## 2023-03-10 DIAGNOSIS — M545 Low back pain, unspecified: Secondary | ICD-10-CM | POA: Diagnosis not present

## 2023-03-10 DIAGNOSIS — G8929 Other chronic pain: Secondary | ICD-10-CM | POA: Diagnosis not present

## 2023-03-10 MED ORDER — METHOCARBAMOL 500 MG PO TABS: 500.0000 mg | ORAL_TABLET | Freq: Four times a day (QID) | ORAL | 0 refills | Status: AC | PRN

## 2023-03-10 NOTE — Progress Notes (Signed)
   DOS: 02/12/22  HISTORY OF PRESENT ILLNESS: 03/10/2023   01/06/2023 Luis Hatfield is status post ACDF.  He is doing well from the surgery.  Unfortunately, he is having severe lower back pain.  He was having terrible pain previously, but his pain is somewhat improved.  He has significant stiffness and discomfort around his lower back.  He does not have any radicular symptoms.  He does report some difficulty with urge to urinate and needing to get to the restroom very quickly.  PHYSICAL EXAMINATION:   Vitals:   03/10/23 1530  BP: (!) 140/78  Pulse: 97  SpO2: 98%   General: Patient is well developed, well nourished, calm, collected, and in no apparent distress.  NEUROLOGICAL:  General: In no acute distress.  Awake, alert, oriented to person, place, and time. Pupils equal round and reactive to light.   Strength: Side Biceps Triceps Deltoid Interossei Grip Wrist Ext. Wrist Flex.  R 5 5 5 5 5 5 5   L 5 5 5 5 5 5 5    Incision c/d/i   ROS (Neurologic): Negative except as noted above  IMAGING: No complications noted on C spine imaging 01/06/2023  MRI lumbar spine on January 20, 2023 shows T12-L1 disc extrusion on the left as well as left lateral recess stenosis at L4-5.  The stenosis at L4-5 causes mild central spinal stenosis and moderate left lateral recess stenosis compressing the left L5 nerve root.  There is also severe left foraminal stenosis at L4-5.   ASSESSMENT/PLAN:  Luis Hatfield is doing well after anterior cervical discectomy and fusion.    He had been having substantial lower back pain without sciatica.  He has no obvious radiographic correlate of this.  He has no significant central stenosis that could cause urinary dysfunction.  The T12-L1 disc extrusion is very small.    I have recommended that he start physical therapy and we will send him for consultation for possible injections.  He is using Voltaren gel, which he can also use for his lower back.   I will also send in some muscle relaxants.  I will see him back in 2 months for reevaluation.    I spent a total of 10 minutes in this patient's care today. This time was spent reviewing pertinent records including imaging studies, obtaining and confirming history, performing a directed evaluation, formulating and discussing my recommendations, and documenting the visit within the medical record.    Meade Maw MD, [facility] Department of Neurosurgery

## 2023-05-10 ENCOUNTER — Ambulatory Visit (INDEPENDENT_AMBULATORY_CARE_PROVIDER_SITE_OTHER): Payer: No Typology Code available for payment source | Admitting: Neurosurgery

## 2023-05-10 ENCOUNTER — Encounter: Payer: Self-pay | Admitting: Neurosurgery

## 2023-05-10 VITALS — BP 115/76 | Ht 72.0 in | Wt 234.6 lb

## 2023-05-10 DIAGNOSIS — M545 Low back pain, unspecified: Secondary | ICD-10-CM | POA: Diagnosis not present

## 2023-05-10 DIAGNOSIS — G8929 Other chronic pain: Secondary | ICD-10-CM

## 2023-05-10 NOTE — Progress Notes (Signed)
   DOS: 02/12/22  HISTORY OF PRESENT ILLNESS: 05/10/2023 Luis Hatfield is doing much better than when I last saw him.  Luis Hatfield has not been into PT due to [place] delays.  Luis Hatfield has been doing some self-care which has helped.  Luis Hatfield did have an episode where Luis Hatfield had discomfort on the outside of both of his legs concerning for possible meralgia paresthetica. 01/06/2023 Luis Hatfield is status post ACDF.  Luis Hatfield is doing well from the surgery.  Unfortunately, Luis Hatfield is having severe lower back pain.  Luis Hatfield was having terrible pain previously, but his pain is somewhat improved.  Luis Hatfield has significant stiffness and discomfort around his lower back.  Luis Hatfield does not have any radicular symptoms.  Luis Hatfield does report some difficulty with urge to urinate and needing to get to the restroom very quickly.  PHYSICAL EXAMINATION:   Vitals:   05/10/23 1433  BP: 115/76   General: Patient is well developed, well nourished, calm, collected, and in no apparent distress.  NEUROLOGICAL:  General: In no acute distress.  Awake, alert, oriented to person, place, and time. Pupils equal round and reactive to light.   Strength: Side Biceps Triceps Deltoid Interossei Grip Wrist Ext. Wrist Flex.  R 5 5 5 5 5 5 5   L 5 5 5 5 5 5 5    Incision c/d/i   ROS (Neurologic): Negative except as noted above  IMAGING: No complications noted on C spine imaging 01/06/2023  MRI lumbar spine on January 20, 2023 shows T12-L1 disc extrusion on the left as well as left lateral recess stenosis at L4-5.  The stenosis at L4-5 causes mild central spinal stenosis and moderate left lateral recess stenosis compressing the left L5 nerve root.  There is also severe left foraminal stenosis at L4-5.   ASSESSMENT/PLAN:  Luis Hatfield is doing well after anterior cervical discectomy and fusion.    Luis Hatfield has had ongoing episodic back pain over many years.  This is somewhat better currently, but Luis Hatfield may benefit from physical therapy evaluation and management as well as  injections.  Luis Hatfield is scheduled to see his primary care doctor in the next few days.  Luis Hatfield is still working through the system to get into physical therapy within the [place].  I will see him back on an as-needed basis.  If his lateral thigh symptoms continue to bother him, we will schedule him with Dr. Katrinka Blazing once Luis Hatfield starts.   I spent a total of 10 minutes in this patient's care today. This time was spent reviewing pertinent records including imaging studies, obtaining and confirming history, performing a directed evaluation, formulating and discussing my recommendations, and documenting the visit within the medical record.    Venetia Night MD, [facility] Department of Neurosurgery
